# Patient Record
Sex: Female | Born: 1965 | Race: White | Hispanic: No | Marital: Married | Smoking: Never smoker
Health system: Southern US, Community
[De-identification: ages and names within clinical notes are randomized; demographics above are authoritative.]

## PROBLEM LIST (undated history)

## (undated) DIAGNOSIS — F909 Attention-deficit hyperactivity disorder, unspecified type: Secondary | ICD-10-CM

## (undated) DIAGNOSIS — C73 Malignant neoplasm of thyroid gland: Secondary | ICD-10-CM

## (undated) DIAGNOSIS — E785 Hyperlipidemia, unspecified: Secondary | ICD-10-CM

## (undated) DIAGNOSIS — M51369 Other intervertebral disc degeneration, lumbar region without mention of lumbar back pain or lower extremity pain: Secondary | ICD-10-CM

## (undated) DIAGNOSIS — I10 Essential (primary) hypertension: Secondary | ICD-10-CM

## (undated) DIAGNOSIS — C50919 Malignant neoplasm of unspecified site of unspecified female breast: Secondary | ICD-10-CM

## (undated) DIAGNOSIS — K589 Irritable bowel syndrome without diarrhea: Secondary | ICD-10-CM

## (undated) DIAGNOSIS — J302 Other seasonal allergic rhinitis: Secondary | ICD-10-CM

## (undated) DIAGNOSIS — E039 Hypothyroidism, unspecified: Secondary | ICD-10-CM

## (undated) DIAGNOSIS — M5136 Other intervertebral disc degeneration, lumbar region: Secondary | ICD-10-CM

## (undated) DIAGNOSIS — C801 Malignant (primary) neoplasm, unspecified: Secondary | ICD-10-CM

## (undated) DIAGNOSIS — G4733 Obstructive sleep apnea (adult) (pediatric): Secondary | ICD-10-CM

## (undated) DIAGNOSIS — K219 Gastro-esophageal reflux disease without esophagitis: Secondary | ICD-10-CM

## (undated) HISTORY — PX: THYROID SURGERY: SHX805

## (undated) HISTORY — DX: Malignant neoplasm of unspecified site of unspecified female breast: C50.919

## (undated) HISTORY — DX: Gastro-esophageal reflux disease without esophagitis: K21.9

## (undated) HISTORY — DX: Hypothyroidism, unspecified: E03.9

## (undated) HISTORY — DX: Obstructive sleep apnea (adult) (pediatric): G47.33

## (undated) HISTORY — DX: Irritable bowel syndrome, unspecified: K58.9

## (undated) HISTORY — DX: Hyperlipidemia, unspecified: E78.5

## (undated) HISTORY — DX: Other seasonal allergic rhinitis: J30.2

## (undated) HISTORY — DX: Attention-deficit hyperactivity disorder, unspecified type: F90.9

## (undated) HISTORY — DX: Other intervertebral disc degeneration, lumbar region: M51.36

## (undated) HISTORY — DX: Other intervertebral disc degeneration, lumbar region without mention of lumbar back pain or lower extremity pain: M51.369

## (undated) HISTORY — DX: Malignant neoplasm of thyroid gland: C73

---

## 2006-04-01 ENCOUNTER — Ambulatory Visit: Payer: Self-pay | Admitting: Obstetrics and Gynecology

## 2007-06-04 ENCOUNTER — Ambulatory Visit: Payer: Self-pay

## 2009-03-01 ENCOUNTER — Ambulatory Visit: Payer: Self-pay | Admitting: Obstetrics and Gynecology

## 2010-04-30 ENCOUNTER — Inpatient Hospital Stay: Payer: Self-pay

## 2012-04-28 ENCOUNTER — Ambulatory Visit: Payer: Self-pay | Admitting: Internal Medicine

## 2013-06-13 ENCOUNTER — Ambulatory Visit: Payer: Self-pay | Admitting: Physician Assistant

## 2013-07-14 ENCOUNTER — Ambulatory Visit: Payer: Self-pay | Admitting: Internal Medicine

## 2013-09-07 ENCOUNTER — Ambulatory Visit: Payer: Self-pay | Admitting: Surgery

## 2013-09-07 DIAGNOSIS — I10 Essential (primary) hypertension: Secondary | ICD-10-CM

## 2013-09-07 LAB — CBC WITH DIFFERENTIAL/PLATELET
BASOS ABS: 0 10*3/uL (ref 0.0–0.1)
BASOS PCT: 0.3 %
EOS PCT: 1.2 %
Eosinophil #: 0.1 10*3/uL (ref 0.0–0.7)
HCT: 36.3 % (ref 35.0–47.0)
HGB: 12.3 g/dL (ref 12.0–16.0)
LYMPHS PCT: 20.2 %
Lymphocyte #: 1.5 10*3/uL (ref 1.0–3.6)
MCH: 27.6 pg (ref 26.0–34.0)
MCHC: 33.8 g/dL (ref 32.0–36.0)
MCV: 82 fL (ref 80–100)
MONO ABS: 0.3 x10 3/mm (ref 0.2–0.9)
Monocyte %: 3.8 %
NEUTROS PCT: 74.5 %
Neutrophil #: 5.5 10*3/uL (ref 1.4–6.5)
Platelet: 244 10*3/uL (ref 150–440)
RBC: 4.45 10*6/uL (ref 3.80–5.20)
RDW: 15.1 % — ABNORMAL HIGH (ref 11.5–14.5)
WBC: 7.4 10*3/uL (ref 3.6–11.0)

## 2013-09-07 LAB — BASIC METABOLIC PANEL
ANION GAP: 3 — AB (ref 7–16)
BUN: 13 mg/dL (ref 7–18)
CREATININE: 0.65 mg/dL (ref 0.60–1.30)
Calcium, Total: 9.3 mg/dL (ref 8.5–10.1)
Chloride: 103 mmol/L (ref 98–107)
Co2: 32 mmol/L (ref 21–32)
EGFR (African American): >60
EGFR (Non-African Amer.): >60
Glucose: 109 mg/dL — ABNORMAL HIGH (ref 65–99)
Osmolality: 276 (ref 275–301)
POTASSIUM: 3.6 mmol/L (ref 3.5–5.1)
Sodium: 138 mmol/L (ref 136–145)

## 2013-09-07 LAB — HEPATIC FUNCTION PANEL A (ARMC)
ALBUMIN: 4.3 g/dL (ref 3.4–5.0)
ALK PHOS: 106 U/L
ALT: 90 U/L — AB (ref 12–78)
BILIRUBIN TOTAL: 0.6 mg/dL (ref 0.2–1.0)
SGOT(AST): 47 U/L — ABNORMAL HIGH (ref 15–37)
Total Protein: 7.9 g/dL (ref 6.4–8.2)

## 2013-09-16 ENCOUNTER — Ambulatory Visit: Payer: Self-pay | Admitting: Surgery

## 2013-09-16 HISTORY — PX: THYROID SURGERY: SHX805

## 2013-09-17 LAB — CREATININE, SERUM
CREATININE: 0.8 mg/dL (ref 0.60–1.30)
EGFR (Non-African Amer.): >60

## 2013-09-17 LAB — ALBUMIN: Albumin: 3.8 g/dL (ref 3.4–5.0)

## 2013-09-17 LAB — CALCIUM: Calcium, Total: 8.5 mg/dL (ref 8.5–10.1)

## 2013-09-20 LAB — PATHOLOGY REPORT

## 2013-10-25 ENCOUNTER — Ambulatory Visit: Payer: Self-pay

## 2013-11-04 ENCOUNTER — Ambulatory Visit: Payer: Self-pay

## 2014-09-01 HISTORY — PX: OTHER SURGICAL HISTORY: SHX169

## 2014-12-23 NOTE — Op Note (Signed)
PATIENT NAME:  Lori Castro, Lori Castro MR#:  [id number] DATE OF BIRTH:  [birth year]  DATE OF PROCEDURE:  09/16/2013  PREOPERATIVE DIAGNOSIS: Papillary carcinoma of the thyroid.   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   OPERATION PERFORMED: Total thyroidectomy with central lymph node dissection.   SURGEON: Consuela Mimes, M.D.   FIRST ASSISTANT: Marlyce Huge, MD.   ANESTHESIA: General.   PROCEDURE IN DETAIL: The patient was placed supine on the operating room table and prepped and draped in the usual sterile fashion. An incision was made in the neck crease 2 fingerbreadths above the suprasternal notch and this was carried down through the subcutaneous tissue and the platysma with electrocautery and subplatysmal flaps are created superiorly and inferiorly. The intermediate fascia of the neck was opened in the midline between the strap muscles and the right strap muscle was dissected off of the very diminutive and fairly hard and adherent right thyroid lobe. On the right side, the inferior parathyroid gland was identified as was the recurrent laryngeal nerve and these were spared and left unharmed throughout the procedure. On the right, the superior parathyroid gland was also identified and left unharmed and at the conclusion of the dissection on the right side of the neck, both parathyroid glands were entirely viable. The middle thyroid vein was ligated and divided with the Harmonic scalpel and inferior pole vessels were ligated and divided with the Harmonic scalpel and superior pole vessels were ligated and divided with the Harmonic scalpel and the thyroid lobe was rotated medially. It was located very high in the neck with the isthmus essentially at the cricothyroid membrane and this was dissected off of the trachea and dissection along the anterior border of the trachea was performed medial to the right  recurrent laryngeal nerve such that some of the central compartment lymph nodes (hose underneath the recurrent laryngeal nerve and lateral to it, on the right) were not excised. This central compartment lymph node excision was continued over to the left side of the trachea and here it included a portion of the superior left thymus gland. On the left side, the strap muscles were densely adherent to the small very firm tumor, which was located right on the cricothyroid membrane. The only way to safely perform the procedure was to remove a portion of the strap muscles and leave them adherent to the cancerous nodule.   On the left, the superior pole vessels were ligated and divided with the Harmonic scalpel and the superior parathyroid gland was identified and spared. Since three parathyroid glands were viable, I elected to intentionally remove the left inferior parathyroid gland as part of the central lymph node dissection. The lateral border of this dissection was the carotid sheath and working medially the recurrent laryngeal nerve on the left side was identified and spared. It had a very early bifurcation in that it bifurcated at approximately the level of the superior edge of the thymus gland. Both branches of the left recurrent laryngeal nerve ran parallel to one another all the way to the cricothyroid membrane. Dissection here was performed tediously and the central lymph node dissection was split in order to preserve the nerve and ultimately the specimen was left attached by the tumor to both the nerve and the trachea and cricothyroid membrane. I was able to remove the tumor from all of the trachea, but dissection of the tumor off of the recurrent laryngeal nerve was extremely tedious and  performed sharply with a scalpel. I utilized the Harmonic scalpel and bipolar electrocautery when near the nerve as well as placement of 1 or 2 very small hemoclips in order to do my best to preserve its function, but  ultimately as the specimen was removed; there was a portion of cancer left attached to both of the left recurrent laryngeal nerve branches. This tumor measured 2 x 3 x 4 mm and was present approximately 3 mm prior to its piercing the cricothyroid membrane. Hemostasis here was achieved with topical thrombin-soaked Gelfoam, which was removed prior to closure. Otherwise, hemostasis was excellent.   A TLS drain was placed on both sides of the neck and brought out through the suprasternal notch and the intermediate fascia was closed with a running 3-0 Monocryl suture. The platysma was closed with a running 3-0 Monocryl suture and the skin was closed with a running subcuticular 5-0 Monocryl and suture strips. The patient tolerated the procedure well and there were no complications.    ____________________________ Consuela Mimes, MD wfm:aw D: 09/16/2013 10:53:39 ET T: 09/16/2013 11:11:18 ET JOB#: [id number]  cc: Consuela Mimes, MD, <Dictator> A. Lavone Orn, MD Consuela Mimes MD ELECTRONICALLY SIGNED 09/23/2013 23:00

## 2014-12-27 ENCOUNTER — Other Ambulatory Visit: Payer: Self-pay | Admitting: Internal Medicine

## 2014-12-27 DIAGNOSIS — R748 Abnormal levels of other serum enzymes: Secondary | ICD-10-CM

## 2015-01-02 ENCOUNTER — Ambulatory Visit: Payer: BC Managed Care – PPO

## 2015-01-05 ENCOUNTER — Ambulatory Visit
Admission: RE | Admit: 2015-01-05 | Discharge: 2015-01-05 | Disposition: A | Discharge status: Home / Self Care | Payer: BC Managed Care – PPO | Source: Ambulatory Visit | Attending: Internal Medicine | Admitting: Internal Medicine

## 2015-01-05 DIAGNOSIS — K829 Disease of gallbladder, unspecified: Secondary | ICD-10-CM | POA: Diagnosis not present

## 2015-01-05 DIAGNOSIS — R945 Abnormal results of liver function studies: Secondary | ICD-10-CM | POA: Diagnosis present

## 2015-01-05 DIAGNOSIS — R748 Abnormal levels of other serum enzymes: Secondary | ICD-10-CM

## 2015-07-04 ENCOUNTER — Other Ambulatory Visit: Payer: Self-pay | Admitting: Internal Medicine

## 2015-07-04 DIAGNOSIS — Z1231 Encounter for screening mammogram for malignant neoplasm of breast: Secondary | ICD-10-CM

## 2015-07-18 ENCOUNTER — Ambulatory Visit: Payer: BC Managed Care – PPO | Attending: Internal Medicine

## 2016-03-03 ENCOUNTER — Other Ambulatory Visit: Payer: Self-pay | Admitting: Internal Medicine

## 2016-03-03 DIAGNOSIS — N63 Unspecified lump in unspecified breast: Secondary | ICD-10-CM

## 2016-03-13 ENCOUNTER — Ambulatory Visit
Admission: RE | Admit: 2016-03-13 | Discharge: 2016-03-13 | Disposition: A | Discharge status: Home / Self Care | Payer: BC Managed Care – PPO | Source: Ambulatory Visit | Attending: Internal Medicine | Admitting: Internal Medicine

## 2016-03-13 DIAGNOSIS — N63 Unspecified lump in unspecified breast: Secondary | ICD-10-CM

## 2016-03-13 DIAGNOSIS — N644 Mastodynia: Secondary | ICD-10-CM | POA: Diagnosis not present

## 2016-03-13 DIAGNOSIS — N6489 Other specified disorders of breast: Secondary | ICD-10-CM | POA: Insufficient documentation

## 2016-12-09 ENCOUNTER — Ambulatory Visit: Payer: BC Managed Care – PPO | Attending: Internal Medicine

## 2016-12-09 DIAGNOSIS — R413 Other amnesia: Secondary | ICD-10-CM | POA: Insufficient documentation

## 2016-12-09 DIAGNOSIS — I10 Essential (primary) hypertension: Secondary | ICD-10-CM | POA: Insufficient documentation

## 2016-12-09 DIAGNOSIS — R51 Headache: Secondary | ICD-10-CM | POA: Diagnosis not present

## 2016-12-09 DIAGNOSIS — G4730 Sleep apnea, unspecified: Secondary | ICD-10-CM | POA: Diagnosis present

## 2016-12-09 DIAGNOSIS — G4733 Obstructive sleep apnea (adult) (pediatric): Secondary | ICD-10-CM | POA: Insufficient documentation

## 2016-12-09 DIAGNOSIS — R0683 Snoring: Secondary | ICD-10-CM | POA: Insufficient documentation

## 2016-12-09 DIAGNOSIS — G4710 Hypersomnia, unspecified: Secondary | ICD-10-CM | POA: Diagnosis present

## 2017-04-15 ENCOUNTER — Ambulatory Visit
Admission: RE | Admit: 2017-04-15 | Discharge: 2017-04-15 | Disposition: A | Discharge status: Home / Self Care | Payer: BC Managed Care – PPO | Source: Ambulatory Visit | Attending: Internal Medicine | Admitting: Internal Medicine

## 2017-04-15 ENCOUNTER — Other Ambulatory Visit: Payer: Self-pay | Admitting: Internal Medicine

## 2017-04-15 DIAGNOSIS — J351 Hypertrophy of tonsils: Secondary | ICD-10-CM | POA: Diagnosis not present

## 2017-04-15 DIAGNOSIS — M47812 Spondylosis without myelopathy or radiculopathy, cervical region: Secondary | ICD-10-CM | POA: Diagnosis not present

## 2017-04-15 DIAGNOSIS — I6523 Occlusion and stenosis of bilateral carotid arteries: Secondary | ICD-10-CM | POA: Insufficient documentation

## 2017-04-15 DIAGNOSIS — Z85850 Personal history of malignant neoplasm of thyroid: Secondary | ICD-10-CM | POA: Diagnosis not present

## 2017-04-15 DIAGNOSIS — R221 Localized swelling, mass and lump, neck: Secondary | ICD-10-CM | POA: Diagnosis present

## 2017-04-15 DIAGNOSIS — R590 Localized enlarged lymph nodes: Secondary | ICD-10-CM | POA: Insufficient documentation

## 2017-04-15 HISTORY — DX: Malignant (primary) neoplasm, unspecified: C80.1

## 2017-04-15 HISTORY — DX: Essential (primary) hypertension: I10

## 2017-04-15 MED ORDER — IOPAMIDOL (ISOVUE-300) INJECTION 61%
75.0000 mL | Freq: Once | INTRAVENOUS | Status: AC | PRN
  Administered 2017-04-15: 75 mL via INTRAVENOUS

## 2017-08-04 ENCOUNTER — Other Ambulatory Visit: Payer: Self-pay | Admitting: Internal Medicine

## 2017-08-05 ENCOUNTER — Other Ambulatory Visit: Payer: Self-pay | Admitting: Internal Medicine

## 2017-08-05 DIAGNOSIS — N649 Disorder of breast, unspecified: Secondary | ICD-10-CM

## 2017-09-14 ENCOUNTER — Other Ambulatory Visit: Payer: BC Managed Care – PPO

## 2017-11-03 ENCOUNTER — Encounter: Payer: Self-pay | Admitting: Emergency Medicine

## 2017-11-03 ENCOUNTER — Emergency Department
Admission: EM | Admit: 2017-11-03 | Discharge: 2017-11-03 | Disposition: A | Discharge status: Home / Self Care | Payer: BC Managed Care – PPO | Attending: Emergency Medicine | Admitting: Emergency Medicine

## 2017-11-03 ENCOUNTER — Emergency Department: Payer: BC Managed Care – PPO

## 2017-11-03 ENCOUNTER — Other Ambulatory Visit: Payer: Self-pay

## 2017-11-03 DIAGNOSIS — Z85850 Personal history of malignant neoplasm of thyroid: Secondary | ICD-10-CM | POA: Insufficient documentation

## 2017-11-03 DIAGNOSIS — Y998 Other external cause status: Secondary | ICD-10-CM | POA: Insufficient documentation

## 2017-11-03 DIAGNOSIS — M542 Cervicalgia: Secondary | ICD-10-CM | POA: Insufficient documentation

## 2017-11-03 DIAGNOSIS — Y939 Activity, unspecified: Secondary | ICD-10-CM | POA: Diagnosis not present

## 2017-11-03 DIAGNOSIS — I10 Essential (primary) hypertension: Secondary | ICD-10-CM | POA: Insufficient documentation

## 2017-11-03 DIAGNOSIS — Y92410 Unspecified street and highway as the place of occurrence of the external cause: Secondary | ICD-10-CM | POA: Insufficient documentation

## 2017-11-03 MED ORDER — MELOXICAM 7.5 MG PO TABS
15.0000 mg | ORAL_TABLET | Freq: Every day | ORAL | Status: DC
  Administered 2017-11-03: 15 mg via ORAL
  Filled 2017-11-03: qty 2

## 2017-11-03 MED ORDER — MELOXICAM 15 MG PO TABS: 15.0000 mg | ORAL_TABLET | Freq: Every day | ORAL | 1 refills | Status: AC

## 2017-11-03 MED ORDER — CYCLOBENZAPRINE HCL 10 MG PO TABS: 10.0000 mg | ORAL_TABLET | Freq: Three times a day (TID) | ORAL | 0 refills | Status: AC | PRN
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
## 2018-03-24 HISTORY — PX: OTHER SURGICAL HISTORY: SHX169

## 2020-04-13 HISTORY — PX: COLONOSCOPY: SHX174

## 2020-09-06 ENCOUNTER — Other Ambulatory Visit: Payer: Self-pay

## 2020-09-06 ENCOUNTER — Ambulatory Visit: Payer: BC Managed Care – PPO | Admitting: Dermatology

## 2020-09-06 DIAGNOSIS — L821 Other seborrheic keratosis: Secondary | ICD-10-CM | POA: Diagnosis not present

## 2020-09-06 DIAGNOSIS — L578 Other skin changes due to chronic exposure to nonionizing radiation: Secondary | ICD-10-CM | POA: Diagnosis not present

## 2020-09-06 DIAGNOSIS — L820 Inflamed seborrheic keratosis: Secondary | ICD-10-CM

## 2020-09-06 DIAGNOSIS — L308 Other specified dermatitis: Secondary | ICD-10-CM

## 2020-09-06 MED ORDER — TRIAMCINOLONE ACETONIDE 0.1 % EX OINT: 1.0000 "application " | TOPICAL_OINTMENT | Freq: Every evening | CUTANEOUS | 0 refills | Status: DC | PRN

## 2020-09-06 MED ORDER — TRIAMCINOLONE ACETONIDE 0.1 % EX CREA: 1.0000 "application " | TOPICAL_CREAM | Freq: Every day | CUTANEOUS | 0 refills | Status: DC | PRN

## 2020-09-06 NOTE — Patient Instructions (Signed)

## 2020-09-06 NOTE — Progress Notes (Unsigned)
   New Patient Visit  Subjective  Lori Castro is a 55 y.o. female who presents for the following: Rash (Fingers - get dry and crack) and Other (Spot on scalp that seems bigger and a spot on her back).  The following portions of the chart were reviewed this encounter and updated as appropriate:   Tobacco  Allergies  Meds  Problems  Med Hx  Surg Hx  Fam Hx     Review of Systems:  No other skin or systemic complaints except as noted in HPI or Assessment and Plan.  Objective  Well appearing patient in no apparent distress; mood and affect are within normal limits.  A focused examination was performed including scalp, face, hands, back. Relevant physical exam findings are noted in the Assessment and Plan.  Objective  Right upper back paraspinal x 1, vertex scalp x 1, right sup medial scapula x 1 (3): 1.0 cm pink verrucous papule of vertex scalp. 1.2 cm waxy brown papule of right upper back paraspinal. 0.5 cm waxy brown papule of right sup medi scapula  Objective  Hands: Pink scaly patches    Assessment & Plan    Actinic Damage - chronic, secondary to cumulative UV radiation exposure/sun exposure over time - diffuse scaly erythematous macules with underlying dyspigmentation - Recommend daily broad spectrum sunscreen SPF 30+ to sun-exposed areas, reapply every 2 hours as needed.  - Call for new or changing lesions.  Seborrheic Keratoses - Stuck-on, waxy, tan-brown papules and plaques  - Discussed benign etiology and prognosis. - Observe - Call for any changes  Inflamed seborrheic keratosis (3) Right upper back paraspinal x 1, vertex scalp x 1, right sup medial scapula x 1  May consider biopsy if not resolved  Destruction of lesion - Right upper back paraspinal x 1, vertex scalp x 1, right sup medial scapula x 1 Complexity: simple   Destruction method: cryotherapy   Informed consent: discussed and consent obtained   Timeout:  patient name, date of birth, surgical  site, and procedure verified Lesion destroyed using liquid nitrogen: Yes   Region frozen until ice ball extended beyond lesion: Yes   Outcome: patient tolerated procedure well with no complications   Post-procedure details: wound care instructions given    Eczema; Atopic Dermatitis; Hand Dermatitis Hands Atopic dermatitis (eczema) is a chronic, relapsing, pruritic condition that can significantly affect quality of life. It is often associated with allergic rhinitis and/or asthma and can require treatment with topical medications, phototherapy, or in severe cases a biologic medication called Dupixent in older children and adults.    Start TMC 0.1% oint qhs prn and TMC 0.1% cream qd prn - Avoid face, groin, underarms.  May consider Eucrisa in the future.  triamcinolone (KENALOG) 0.1 % - Hands  triamcinolone ointment (KENALOG) 0.1 % - Hands  Return in about 2 months (around 11/04/2020).  I, Joanie Coddington, CMA, am acting as scribe for Armida Sans, MD .  Documentation: I have reviewed the above documentation for accuracy and completeness, and I agree with the above.  Armida Sans, MD

## 2020-09-07 ENCOUNTER — Encounter: Payer: Self-pay | Admitting: Dermatology

## 2020-11-08 ENCOUNTER — Ambulatory Visit: Payer: BC Managed Care – PPO | Admitting: Dermatology

## 2021-08-21 ENCOUNTER — Other Ambulatory Visit: Payer: Self-pay | Admitting: Internal Medicine

## 2021-08-21 DIAGNOSIS — Z1231 Encounter for screening mammogram for malignant neoplasm of breast: Secondary | ICD-10-CM

## 2021-10-08 ENCOUNTER — Ambulatory Visit
Admission: RE | Admit: 2021-10-08 | Discharge: 2021-10-08 | Disposition: A | Discharge status: Home / Self Care | Payer: BC Managed Care – PPO | Source: Ambulatory Visit | Attending: Internal Medicine | Admitting: Internal Medicine

## 2021-10-08 ENCOUNTER — Other Ambulatory Visit: Payer: Self-pay

## 2021-10-08 DIAGNOSIS — Z1231 Encounter for screening mammogram for malignant neoplasm of breast: Secondary | ICD-10-CM | POA: Insufficient documentation

## 2021-10-11 ENCOUNTER — Other Ambulatory Visit: Payer: Self-pay | Admitting: Internal Medicine

## 2021-10-11 DIAGNOSIS — R928 Other abnormal and inconclusive findings on diagnostic imaging of breast: Secondary | ICD-10-CM

## 2021-10-11 DIAGNOSIS — N6310 Unspecified lump in the right breast, unspecified quadrant: Secondary | ICD-10-CM

## 2021-10-29 ENCOUNTER — Ambulatory Visit
Admission: RE | Admit: 2021-10-29 | Discharge: 2021-10-29 | Disposition: A | Discharge status: Home / Self Care | Payer: BC Managed Care – PPO | Source: Ambulatory Visit | Attending: Internal Medicine | Admitting: Internal Medicine

## 2021-10-29 ENCOUNTER — Other Ambulatory Visit: Payer: Self-pay

## 2021-10-29 DIAGNOSIS — N6310 Unspecified lump in the right breast, unspecified quadrant: Secondary | ICD-10-CM

## 2021-10-29 DIAGNOSIS — R928 Other abnormal and inconclusive findings on diagnostic imaging of breast: Secondary | ICD-10-CM | POA: Diagnosis not present

## 2021-10-31 ENCOUNTER — Other Ambulatory Visit: Payer: Self-pay | Admitting: Internal Medicine

## 2021-10-31 DIAGNOSIS — N63 Unspecified lump in unspecified breast: Secondary | ICD-10-CM

## 2021-10-31 DIAGNOSIS — R928 Other abnormal and inconclusive findings on diagnostic imaging of breast: Secondary | ICD-10-CM

## 2021-11-12 ENCOUNTER — Ambulatory Visit
Admission: RE | Admit: 2021-11-12 | Discharge: 2021-11-12 | Disposition: A | Discharge status: Home / Self Care | Payer: BC Managed Care – PPO | Source: Ambulatory Visit | Attending: Internal Medicine | Admitting: Internal Medicine

## 2021-11-12 ENCOUNTER — Other Ambulatory Visit: Payer: Self-pay

## 2021-11-12 DIAGNOSIS — R928 Other abnormal and inconclusive findings on diagnostic imaging of breast: Secondary | ICD-10-CM

## 2021-11-12 DIAGNOSIS — N63 Unspecified lump in unspecified breast: Secondary | ICD-10-CM

## 2021-11-12 DIAGNOSIS — N6311 Unspecified lump in the right breast, upper outer quadrant: Secondary | ICD-10-CM | POA: Diagnosis present

## 2021-11-13 DIAGNOSIS — C50919 Malignant neoplasm of unspecified site of unspecified female breast: Secondary | ICD-10-CM

## 2021-11-14 LAB — SURGICAL PATHOLOGY

## 2021-11-15 ENCOUNTER — Encounter: Payer: Self-pay | Admitting: *Deleted

## 2021-11-15 HISTORY — PX: OTHER SURGICAL HISTORY: SHX169

## 2021-11-17 DIAGNOSIS — C50411 Malignant neoplasm of upper-outer quadrant of right female breast: Secondary | ICD-10-CM | POA: Insufficient documentation

## 2021-11-17 NOTE — Progress Notes (Signed)
?[facility]  ?[phone] [phone] ? ?ID: Lori Castro OB: November 19, 1965  MR#: [id number]  JYN#:[id number] ? ?Patient Care Team: ?Adin Hector, MD as PCP - General (Internal Medicine) ?Theodore Demark, RN as Oncology Nurse Navigator ?Lloyd Huger, MD as Consulting Physician (Oncology) ?Adin Hector, MD as Referring Physician (Internal Medicine) ?Noreene Filbert, MD as Consulting Physician (Radiation Oncology) ? 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 ? ?INTERVAL HISTORY: Patient is a 56 year old female who was noted to have an abnormality on routine screening mammogram.  Subsequent ultrasound biopsy revealed the above-stated malignancy.  She currently feels well and is asymptomatic.  She has no neurologic complaints.  She denies any recent fevers or illnesses.  She has a good appetite and denies weight loss.  She has no chest pain, shortness of breath, cough, or hemoptysis.  She denies any nausea, vomiting, constipation, or diarrhea.  She has no urinary complaints.  Patient otherwise feels well and offers no further specific complaints today. ? ?REVIEW OF SYSTEMS:   ?Review of Systems  ?Constitutional: Negative.  Negative for fever, malaise/fatigue and weight loss.  ?Respiratory: Negative.  Negative for cough, hemoptysis and shortness of breath.   ?Cardiovascular: Negative.  Negative for chest pain and leg swelling.  ?Gastrointestinal: Negative.  Negative for abdominal pain.  ?Genitourinary: Negative.  Negative for dysuria.  ?Musculoskeletal: Negative.  Negative for back pain.  ?Skin: Negative.  Negative for rash.  ?Neurological: Negative.  Negative for dizziness, focal weakness, weakness and headaches.  ?Psychiatric/Behavioral: Negative.  The patient is not nervous/anxious.   ? ?As per HPI. Otherwise, a complete review of systems is negative. ? ?PAST MEDICAL HISTORY: ?Past  Medical History:  ?Diagnosis Date  ? ADHD   ? Breast cancer ([place])   ? Cancer [facility])   ? Thyroid  ? DDD (degenerative disc disease), lumbar   ? GERD (gastroesophageal reflux disease)   ? Hyperlipemia   ? Hypertension   ? Hypothyroidism   ? IBS (irritable bowel syndrome)   ? OSA (obstructive sleep apnea)   ? Papillary thyroid carcinoma ([place])   ? Seasonal allergies   ? ? ?PAST SURGICAL HISTORY: ?Past Surgical History:  ?Procedure Laterality Date  ? CESAREAN SECTION    ? COLONOSCOPY  04/13/2020  ? NEUROPLASTY VOCAL CORD  2016  ? R Long Trigger Finger Release  03/24/2018  ? THYROID SURGERY  09/16/2013  ? THYROIDECTOMY TOTAL  ? ultrasound guided core breast biopsy  11/15/2021  ? ? ?FAMILY HISTORY: ?Family History  ?Problem Relation Age of Onset  ? Hypertension Mother   ? Arthritis Mother   ? Hyperlipidemia Mother   ? Hypertension Father   ? Hyperlipidemia Father   ? Diabetes Father   ? Hyperlipidemia Brother   ? Hypertension Brother   ? Thyroid cancer Paternal Grandmother   ? Hypothyroidism Paternal Grandmother   ? Breast cancer Neg Hx   ? ? ?ADVANCED DIRECTIVES (Y/N):  N ? ?HEALTH MAINTENANCE: ?Social History  ? ?Tobacco Use  ? Smoking status: Never  ? Smokeless tobacco: Never  ?Substance Use Topics  ? Alcohol use: Yes  ?  Comment: occasional on the weekend  ? Drug use: No  ? ? ? Colonoscopy: ? PAP: ? Bone density: ? Lipid panel: ? ?Allergies  ?Allergen Reactions  ? Amlodipine Other (See Comments)  ?  Sedation   ? ? ?Current Outpatient Medications  ?Medication Sig Dispense Refill  ?  atorvastatin (LIPITOR) 80 MG tablet Take 80 mg by mouth daily.    ? cyclobenzaprine (FLEXERIL) 10 MG tablet Take 10 mg by mouth 3 (three) times daily as needed for muscle spasms.    ? escitalopram (LEXAPRO) 20 MG tablet Take 20 mg by mouth daily.    ? levothyroxine (SYNTHROID) 150 MCG tablet Take 150 mcg by mouth daily before breakfast.    ? losartan (COZAAR) 100 MG tablet Take 100 mg by mouth daily.    ? metoprolol succinate (TOPROL-XL) 25 MG  24 hr tablet Take 25 mg by mouth daily.    ? ?No current facility-administered medications for this visit.  ? ? ?OBJECTIVE: ?Vitals:  ? 11/19/21 1002  ?BP: 134/69  ?Pulse: 88  ?Resp: 16  ?Temp: 97.8 ?F (36.6 ?C)  ?SpO2: 99%  ?   Body mass index is 29.62 kg/m?[place] Kitchen    ECOG FS:0 - Asymptomatic ? ?General: Well-developed, well-nourished, no acute distress. ?Eyes: Pink conjunctiva, anicteric sclera. ?HEENT: Normocephalic, moist mucous membranes. ?Breast: Exam deferred today. ?Lungs: No audible wheezing or coughing. ?Heart: Regular rate and rhythm. ?Abdomen: Soft, nontender, no obvious distention. ?Musculoskeletal: No edema, cyanosis, or clubbing. ?Neuro: Alert, answering all questions appropriately. Cranial nerves grossly intact. ?Skin: No rashes or petechiae noted. ?Psych: Normal affect. ?Lymphatics: No cervical, calvicular, axillary or inguinal LAD. ? ? ?LAB RESULTS: ? ?Lab Results  ?Component Value Date  ? Castro 138 09/07/2013  ? K 3.6 09/07/2013  ? CL 103 09/07/2013  ? CO2 32 09/07/2013  ? GLUCOSE 109 (H) 09/07/2013  ? BUN 13 09/07/2013  ? CREATININE 0.80 09/17/2013  ? CALCIUM 8.5 09/17/2013  ? PROT 7.9 09/07/2013  ? ALBUMIN 3.8 09/17/2013  ? AST 47 (H) 09/07/2013  ? ALT 90 (H) 09/07/2013  ? ALKPHOS 106 09/07/2013  ? BILITOT 0.6 09/07/2013  ? GFRNONAA >60 09/17/2013  ? GFRAA >60 09/17/2013  ? ? ?Lab Results  ?Component Value Date  ? WBC 7.4 09/07/2013  ? NEUTROABS 5.5 09/07/2013  ? HGB 12.3 09/07/2013  ? HCT 36.3 09/07/2013  ? MCV 82 09/07/2013  ? PLT 244 09/07/2013  ? ? ? ?STUDIES: ?US BREAST LTD UNI RIGHT INC AXILLA ? ?Result Date: 10/29/2021 ?CLINICAL DATA:  Patient recalled from screening for right breast mass. EXAM: DIGITAL DIAGNOSTIC UNILATERAL RIGHT MAMMOGRAM WITH TOMOSYNTHESIS AND CAD; ULTRASOUND RIGHT BREAST LIMITED TECHNIQUE: Right digital diagnostic mammography and breast tomosynthesis was performed. The images were evaluated with computer-aided detection.; Targeted ultrasound examination of the right breast was  performed COMPARISON:  Previous exam(s). ACR Breast Density Category c: The breast tissue is heterogeneously dense, which may obscure small masses. FINDINGS: Within the upper-outer right breast middle to posterior depth there is a persistent small spiculated mass further evaluated with additional imaging. Additional left MLO view was obtained. No suspicious abnormality within the left breast. Targeted ultrasound is performed, showing a 9 x 8 x 8 mm irregular hypoechoic mass right breast 9:30 o'clock 4 cm from the nipple. No right axillary adenopathy. IMPRESSION: Suspicious right breast mass 9:30 o'clock. RECOMMENDATION: Ultrasound-guided core needle biopsy right breast mass 9:30 o'clock. I have discussed the findings and recommendations with the patient. If applicable, a reminder letter will be sent to the patient regarding the next appointment. BI-RADS CATEGORY  5: Highly suggestive of malignancy. Electronically Signed   By: Lovey Newcomer M.D.   On: 10/29/2021 11:47 ? ?MM DIAG BREAST TOMO UNI RIGHT ? ?Result Date: 10/29/2021 ?CLINICAL DATA:  Patient recalled from screening for right breast mass. EXAM: DIGITAL DIAGNOSTIC UNILATERAL  RIGHT MAMMOGRAM WITH TOMOSYNTHESIS AND CAD; ULTRASOUND RIGHT BREAST LIMITED TECHNIQUE: Right digital diagnostic mammography and breast tomosynthesis was performed. The images were evaluated with computer-aided detection.; Targeted ultrasound examination of the right breast was performed COMPARISON:  Previous exam(s). ACR Breast Density Category c: The breast tissue is heterogeneously dense, which may obscure small masses. FINDINGS: Within the upper-outer right breast middle to posterior depth there is a persistent small spiculated mass further evaluated with additional imaging. Additional left MLO view was obtained. No suspicious abnormality within the left breast. Targeted ultrasound is performed, showing a 9 x 8 x 8 mm irregular hypoechoic mass right breast 9:30 o'clock 4 cm from the  nipple. No right axillary adenopathy. IMPRESSION: Suspicious right breast mass 9:30 o'clock. RECOMMENDATION: Ultrasound-guided core needle biopsy right breast mass 9:30 o'clock. I have discussed the findings

## 2021-11-18 ENCOUNTER — Other Ambulatory Visit: Payer: Self-pay | Admitting: General Surgery

## 2021-11-18 DIAGNOSIS — C50411 Malignant neoplasm of upper-outer quadrant of right female breast: Secondary | ICD-10-CM

## 2021-11-18 NOTE — Progress Notes (Signed)
Subjective:  ?  ? Patient ID: Lori Castro is a 56 y.o. female. ?  ?HPI ?  ?The following portions of the patient's history were reviewed and updated as appropriate. ?  ?This a new patient is here today for: office visit. The patient has been referred by Al Pimple, RN for evaluation of a newly diagnosed right breast cancer. Patient had a right breast ultrasound guided biopsy on 11-14-21 at [facility]. The patient reports she had been getting yearly mammograms and had not noticed any breast changes prior to her recent mammogram. She denies any breast trauma or nipple discharge.  ?  ?Since being informed of her diagnosis late last week she has been on the Internet and was very concerned that she might have triple negative breast cancer.  She reports a friend recently succumbed to this disease. ?  ?The patient had her first mammogram in several years last month, was called back for additional views and then 5 weeks later had a biopsy.  This showed invasive mammary carcinoma. ?  ?The patient reports that she had been unaware to been since July 2017's since she had her mammograms, but reported at least one was canceled during the [place] pandemic. ?  ?She herself is not aware of any problems. ?  ?She is the mother of 4 and recently remarried gentleman who she had dated in college.  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  Her children are 1, 24, 21 and 11.  A boy followed by 3 girls. ?  ?No prior breast problems.  Her new husband is a retired Tourist information centre manager from the [place]. ?  ?Patient reports her bra size is a 36 C.  ?  ?The patient is accompanied by her husband, Lori Castro.  ?  ?  ?   ?Chief Complaint  ?Patient presents with  ? Treatment Plan Discussion  ?  ?  ?BP (!) 146/88   Pulse 94   Temp 36.6 ?C (97.9 ?F)   Ht 165.1 cm ('5\' 5"' )   Wt 80.3 kg (177 lb)   LMP 05/10/2013 (Approximate)   SpO2 97%   BMI 29.45 kg/m?  ?  ?    ?Past Medical History:  ?Diagnosis Date  ? ADHD (attention deficit  hyperactivity disorder) 11/17/2014  ? Anemia during pregnancy    ? Breast cancer (CMS-HCC) 11/12/2021  ? Depression with anxiety    ?  followed by Dr Owens Shark  ? GERD (gastroesophageal reflux disease)    ? History of hypertension    ? 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 Hyperlipemia    ?  with elevated HDL  ? Hypothyroidism 2007  ? IBS (irritable bowel syndrome)    ? Other and unspecified ovarian cyst    ? Papillary thyroid carcinoma (CMS-HCC) 07/2013  ?  left side 1.5cm with invasion   ? Post-operative hypothyroidism    ? Seasonal allergies    ?  followed by Dr. Donneta Romberg with [facility]  ?  ?  ?     ?Past Surgical History:  ?Procedure Laterality Date  ? CESAREAN SECTION   05/01/2010  ? THYROIDECTOMY TOTAL   09/16/2013  ?  with central node disection, left inferior parathyroid gland resection and resection of portion of the thymus and attached strap muscle fibers by Dr. Leanora Cover   ? NEUROPLASTY VOCAL CORD   10/2014  ?  Dr Joya Gaskins at [facility] 03/24/18  03/24/2018  ?  Hessie Knows, MD  ? COLONOSCOPY   04/13/2020  ?  Normal colon/Repeat 64yr/TKT  ? ultrasound guided core breast biopsy Right 11/12/2021  ?  ?  ?  ?        ?OB History   ?  Gravida  ?4  ? Para  ?4  ? Term  ?   ? Preterm  ?   ? AB  ?   ? Living  ?   ?  ?  SAB  ?   ? IAB  ?   ? Ectopic  ?   ? Molar  ?   ? Multiple  ?   ? Live Births  ?   ?  ?  ?  Obstetric Comments  ?Age at first period 89?Age of first pregnancy 89?   ?  ?   ?  ?  ?Social History  ?  ?  ?     ?Socioeconomic History  ? Marital status: Married  ? Number of children: 4  ?Occupational History  ? Occupation: tPharmacist, hospital ?Tobacco Use  ? Smoking status: Never  ?    Passive exposure: Never  ? Smokeless tobacco: Never  ?Vaping Use  ? Vaping Use: Never used  ?Substance and Sexual Activity  ? Alcohol use: Yes  ?    Comment: on weekends  ? Drug use: No  ? Sexual activity: Defer  ?  ?  ?  ?     ?Allergies  ?Allergen Reactions  ? Amlodipine Other (See  Comments)  ?    sedation  ?  ?  ?Current Medications  ?      ?Current Outpatient Medications  ?Medication Sig Dispense Refill  ? acetaminophen (TYLENOL ORAL) Take by mouth as needed      ? albuterol 90 mcg/actuation inhaler Inhale 2 inhalations into the lungs every 6 (six) hours as needed for Wheezing 18 g 2  ? atorvastatin (LIPITOR) 80 MG tablet TAKE 1 TABLET BY MOUTH EVERY DAY 90 tablet 1  ? chlorthalidone 25 MG tablet TAKE 1 TABLET BY MOUTH EVERY DAY 90 tablet 1  ? escitalopram oxalate (LEXAPRO) 20 MG tablet TAKE 1 TABLET BY MOUTH EVERY DAY 90 tablet 1  ? levothyroxine (SYNTHROID) 150 MCG tablet Take 1 tablet (150 mcg total) by mouth once daily Take on an empty stomach with a glass of water at least 30-60 minutes before breakfast. 30 tablet 11  ? losartan (COZAAR) 100 MG tablet TAKE 1 TABLET BY MOUTH EVERY DAY 60 tablet 2  ? metoprolol succinate (TOPROL-XL) 25 MG XL tablet TAKE 1 TABLET BY MOUTH EVERY DAY 90 tablet 3  ? predniSONE (DELTASONE) 10 MG tablet Take 4 tabs daily for 2 days, then 3 tabs daily for 2 days, then 2 tabs daily for 2 days, then 1 tab daily for 2 days (Patient not taking: Reported on 11/18/2021) 20 tablet 0  ?  ?No current facility-administered medications for this visit.  ?  ?  ?  ?     ?Family History  ?Problem Relation Age of Onset  ? Arthritis Mother    ?      starting in her 334s ? Diabetes Father    ?      adult onset  ? Prostate cancer Paternal Grandfather    ? Thyroid cancer Paternal Grandfather    ? Hypothyroidism Paternal Grandfather    ? Breast cancer Neg Hx    ?  ?  ?  ?Labs and  Radiology:  ?  ?Right breast ultrasound: ?  ?Ultrasound examination of the right breast was undertaken to determine if preoperative localization would be required.  In the 930 o'clock position 4 cm from the nipple a well-defined hypoechoic nodule with a centrally placed clip measuring 0.5 x 0.7 x 0.83 cm is noted.  Slight hyperemia around the tumor site.  This is about 1/2 cm below the skin. ?  ?In the  axilla there are either 2 side-by-side nodes or 1 enlarged node measuring maximum dimension 2.64 cm with a cortex of 0.23 cm.  Prebiopsy assessment by the radiology service showed no right axillary adenopathy, and I suspect that this is postbiopsy change.  BI-RADS-6. ?  ?November 12, 2021 pathology: ?  ?A. BREAST, RIGHT AT 930 O'CLOCK; ULTRASOUND-GUIDED CORE NEEDLE BIOPSY:  ?- INVASIVE MAMMARY CARCINOMA, NO SPECIAL TYPE.  ?- CALCIFICATIONS ASSOCIATED WITH NEOPLASTIC MAMMARY ELEMENTS.  ? ?Size of invasive carcinoma: 9 mm in this sample  ?Histologic grade of invasive carcinoma: Grade 1  ?                     Glandular/tubular differentiation score: 2  ?                     Nuclear pleomorphism score: 2  ?                     Mitotic rate score: 1  ?                     Total score: 5  ?Ductal carcinoma in situ: Present, low-grade  ?Lymphovascular invasion: Not identified  ?  ?CASE SUMMARY: BREAST BIOMARKER TESTS  ?Estrogen Receptor (ER) Status: POSITIVE  ?        Percentage of cells with nuclear positivity: >90%  ?        Average intensity of staining: Strong  ? ?Progesterone Receptor (PgR) Status: POSITIVE  ?        Percentage of cells with nuclear positivity: 11-50%  ?        Average intensity of staining: Moderate  ? ?HER2 (by immunohistochemistry): NEGATIVE (Score 1+) ?  ?Imaging studies from July 2017 through spring 2023 were independently reviewed. ?  ?Radiologist recommendations regarding MRI noted. ?  ?Laboratory review September 26, 2021: ?  ?WBC (White Blood Cell Count) 4.1 - 10.2 10?3/uL 5.5   ?RBC (Red Blood Cell Count) 4.04 - 5.48 10?6/uL 4.06   ?Hemoglobin 12.0 - 15.0 gm/dL 12.2   ?Hematocrit 35.0 - 47.0 % 37.4   ?MCV (Mean Corpuscular Volume) 80.0 - 100.0 fl 92.1   ?MCH (Mean Corpuscular Hemoglobin) 27.0 - 31.2 pg 30.0   ?MCHC (Mean Corpuscular Hemoglobin Concentration) 32.0 - 36.0 gm/dL 32.6   ?Platelet Count 150 - 450 10?3/uL 215   ?RDW-CV (Red Cell Distribution Width) 11.6 - 14.8 % 13.4   ?MPV (Mean  Platelet Volume) 9.4 - 12.4 fl 10.3   ?Neutrophils 1.50 - 7.80 10?3/uL 3.04   ?Lymphocytes 1.00 - 3.60 10?3/uL 1.94   ?Monocytes 0.00 - 1.50 10?3/uL 0.34   ?Eosinophils 0.00 - 0.55 10?3/uL 0.10   ?Basophils 0.00 - 0.09 10?3

## 2021-11-19 ENCOUNTER — Other Ambulatory Visit: Payer: Self-pay

## 2021-11-19 ENCOUNTER — Inpatient Hospital Stay: Payer: BC Managed Care – PPO

## 2021-11-19 ENCOUNTER — Inpatient Hospital Stay: Payer: BC Managed Care – PPO | Attending: Oncology | Admitting: Oncology

## 2021-11-19 ENCOUNTER — Encounter: Payer: Self-pay | Admitting: Oncology

## 2021-11-19 DIAGNOSIS — Z85850 Personal history of malignant neoplasm of thyroid: Secondary | ICD-10-CM | POA: Insufficient documentation

## 2021-11-19 DIAGNOSIS — C50411 Malignant neoplasm of upper-outer quadrant of right female breast: Secondary | ICD-10-CM | POA: Insufficient documentation

## 2021-11-19 DIAGNOSIS — Z170 Estrogen receptor positive status [ER+]: Secondary | ICD-10-CM | POA: Diagnosis not present

## 2021-11-19 NOTE — Progress Notes (Signed)
Received Biopsy results and patient notification from Electa Sniff at [facility]. Navigation initiated.  Scheduled Surgical and Med/Onc consults.  ?

## 2021-11-19 NOTE — Progress Notes (Signed)
Supported patient , and her husband at initial Med/Onc  visit with Dr. Grayland Ormond.  States she is so much less worried after meeting physicians, and reviewing treatment plan.  Given Breast Cancer Treatment Handbook, and packet.  Surgery scheduled for 11/22/21. Follow-up 4/5 with Dr. Grayland Ormond and consult Dr. Baruch Gouty. ?

## 2021-11-20 ENCOUNTER — Other Ambulatory Visit: Payer: Self-pay | Admitting: General Surgery

## 2021-11-20 DIAGNOSIS — Z170 Estrogen receptor positive status [ER+]: Secondary | ICD-10-CM

## 2021-11-21 ENCOUNTER — Other Ambulatory Visit
Admission: RE | Admit: 2021-11-21 | Discharge: 2021-11-21 | Disposition: A | Discharge status: Home / Self Care | Payer: BC Managed Care – PPO | Source: Ambulatory Visit | Attending: General Surgery | Admitting: General Surgery

## 2021-11-21 ENCOUNTER — Other Ambulatory Visit: Payer: Self-pay

## 2021-11-21 NOTE — Patient Instructions (Signed)
?Your procedure is scheduled on: Friday November 22, 2021. ?Report to Day Surgery inside [facility] 2nd floor, stop by admissions desk before getting on elevator. ?To find out your arrival time please call [phone] between 1PM - 3PM on Thursday November 21, 2021. ? ?Remember: Instructions that are not followed completely may result in serious medical risk,  ?up to and including death, or upon the discretion of your surgeon and anesthesiologist your  ?surgery may need to be rescheduled.  ? ?  _X__ 1. Do not eat food or drink fluids after midnight the night before your procedure. ?                No chewing gum or hard candies. ? ?__X__2.  On the morning of surgery brush your teeth with toothpaste and water, you ?               may rinse your mouth with mouthwash if you wish.  Do not swallow any toothpaste or mouthwash. ?   ? _X__ 3.  No Alcohol for 24 hours before or after surgery. ? ? _X__ 4.  Do Not Smoke or use e-cigarettes For 24 Hours Prior to Your Surgery. ?                Do not use any chewable tobacco products for at least 6 hours prior to ?                Surgery. ? ?_X__  5.  Do not use any recreational drugs (marijuana, cocaine, heroin, ecstasy, MDMA or other) ?               For at least one week prior to your surgery.  Combination of these drugs with anesthesia ?               May have life threatening results. ? ?____  6.  Bring all medications with you on the day of surgery if instructed.  ? ?__X__  7.  Notify your doctor if there is any change in your medical condition  ?    (cold, fever, infections). ?    ?Do not wear jewelry, make-up, hairpins, clips or nail polish. ?Do not wear lotions, powders, or perfumes. You may wear deodorant. ?Do not shave 48 hours prior to surgery. Men may shave face and neck. ?Do not bring valuables to the hospital.   ? ?[facility] is not responsible for any belongings or valuables. ? ?Contacts, dentures or bridgework may not be worn into  surgery. ?Leave your suitcase in the car. After surgery it may be brought to your room. ?For patients admitted to the hospital, discharge time is determined by your ?treatment team. ?  ?Patients discharged the day of surgery will not be allowed to drive home.   ?Make arrangements for someone to be with you for the first 24 hours of your ?Same Day Discharge. ? ? ? ?__X__ Take these medicines the morning of surgery with A SIP OF WATER:  ? ? 1. atorvastatin (LIPITOR) 80 MG  ? 2. escitalopram (LEXAPRO) 20  ? 3. levothyroxine (SYNTHROID) 150 MCG ? 4. metoprolol succinate (TOPROL-XL) 25 MG 24 ? 5. ? 6. ? ?____ Fleet Enema (as directed)  ? ?__X__ Use CHG Soap (or wipes) as directed ? ?____ Use Benzoyl Peroxide Gel as instructed ? ?____ Use inhalers on the day of surgery ? ?____ Stop metformin 2 days prior to surgery   ? ?____ Take 1/2 of usual insulin  dose the night before surgery. No insulin the morning ?         of surgery.  ? ?____ Call your PCP, cardiologist, or Pulmonologist if taking Coumadin/Plavix/aspirin and ask when to stop before your surgery.  ? ?__X__ One Week prior to surgery- Stop Anti-inflammatories such as Ibuprofen, Aleve, Advil, Motrin, meloxicam (MOBIC), diclofenac, etodolac, ketorolac, Toradol, Daypro, piroxicam, Goody's or BC powders. OK TO USE TYLENOL IF NEEDED ?  ?__X__ Stop supplements until after surgery.   ? ?____ Bring C-Pap to the hospital.  ? ? ?If you have any questions regarding your pre-procedure instructions,  ?Please call Pre-admit Testing at [phone] ?

## 2021-11-21 NOTE — Patient Instructions (Signed)
?Your procedure is scheduled on: Friday November 22, 2021. ?Report to Day Surgery inside [facility] 2nd floor, stop by admissions desk before getting on elevator.  ?To find out your arrival time please call [phone] between 1PM - 3PM on Thursday November 21, 2021. ? ?Remember: Instructions that are not followed completely may result in serious medical risk,  ?up to and including death, or upon the discretion of your surgeon and anesthesiologist your  ?surgery may need to be rescheduled.  ? ?  _X__ 1. Do not eat food after midnight the night before your procedure. ?                No chewing gum or hard candies. You may drink clear liquids up to 2 hours ?                before you are scheduled to arrive for your surgery- DO not drink clear ?                liquids within 2 hours of the start of your surgery. ?                Clear Liquids include:  water, apple juice without pulp, clear Gatorade, G2 or  ?                Gatorade Zero (avoid Red/Purple/Blue), Black Coffee or Tea (Do not add ?                anything to coffee or tea). ? ?__X__2.  On the morning of surgery brush your teeth with toothpaste and water, you ?               may rinse your mouth with mouthwash if you wish.  Do not swallow any toothpaste of mouthwash. ?   ? _X__ 3.  No Alcohol for 24 hours before or after surgery. ? ? _X__ 4.  Do Not Smoke or use e-cigarettes For 24 Hours Prior to Your Surgery. ?                Do not use any chewable tobacco products for at least 6 hours prior to ?                Surgery. ? ?_X__  5.  Do not use any recreational drugs (marijuana, cocaine, heroin, ecstasy, MDMA or other) ?               For at least one week prior to your surgery.  Combination of these drugs with anesthesia ?               May have life threatening results. ? ?____  6.  Bring all medications with you on the day of surgery if instructed.  ? ?__X__  7.  Notify your doctor if there is any change in your medical condition   ?    (cold, fever, infections). ?    ?Do not wear jewelry, make-up, hairpins, clips or nail polish. ?Do not wear lotions, powders, or perfumes. You may wear deodorant. ?Do not shave 48 hours prior to surgery. Men may shave face and neck. ?Do not bring valuables to the hospital.   ? ?[facility] is not responsible for any belongings or valuables. ? ?Contacts, dentures or bridgework may not be worn into surgery. ?Leave your suitcase in the car. After surgery it may be brought to your room. ?For patients admitted to the hospital, discharge time is  determined by your ?treatment team. ?  ?Patients discharged the day of surgery will not be allowed to drive home.   ?Make arrangements for someone to be with you for the first 24 hours of your ?Same Day Discharge. ? ? ?_X___ Take these medicines the morning of surgery with A SIP OF WATER:  ? ? 1. atorvastatin (LIPITOR) 80 MG ? 2. escitalopram (LEXAPRO) 20 MG ? 3. levothyroxine (SYNTHROID) 150 MCG ? 4. metoprolol succinate (TOPROL-XL) 25 MG ? 5. ? 6. ? ?____ Fleet Enema (as directed)  ? ?__X__ Use CHG Soap (or wipes) as directed ? ?____ Use Benzoyl Peroxide Gel as instructed ? ?____ Use inhalers on the day of surgery ? ?____ Stop metformin 2 days prior to surgery   ? ?____ Take 1/2 of usual insulin dose the night before surgery. No insulin the morning ?         of surgery.  ? ?____ Call your PCP, cardiologist, or Pulmonologist if taking Coumadin/Plavix/aspirin and ask when to stop before your surgery.  ? ?__X__ One Week prior to surgery- Stop Anti-inflammatories such as Ibuprofen, Aleve, Advil, Motrin, meloxicam (MOBIC), diclofenac, etodolac, ketorolac, Toradol, Daypro, piroxicam, Goody's or BC powders. OK TO USE TYLENOL IF NEEDED ?  ?__X__ Stop supplements until after surgery.   ? ?____ Bring C-Pap to the hospital.  ? ? ?If you have any questions regarding your pre-procedure instructions,  ?Please call Pre-admit Testing at [phone] ?

## 2021-11-22 ENCOUNTER — Ambulatory Visit
Admission: RE | Admit: 2021-11-22 | Discharge: 2021-11-22 | Disposition: A | Discharge status: Home / Self Care | Payer: BC Managed Care – PPO | Source: Ambulatory Visit | Attending: General Surgery | Admitting: General Surgery

## 2021-11-22 ENCOUNTER — Ambulatory Visit: Payer: BC Managed Care – PPO | Admitting: Registered Nurse

## 2021-11-22 ENCOUNTER — Ambulatory Visit
Admission: RE | Admit: 2021-11-22 | Discharge: 2021-11-22 | Disposition: A | Discharge status: Home / Self Care | Payer: BC Managed Care – PPO | Attending: General Surgery | Admitting: General Surgery

## 2021-11-22 ENCOUNTER — Other Ambulatory Visit: Payer: Self-pay

## 2021-11-22 ENCOUNTER — Encounter
Admission: RE | Disposition: A | Discharge status: Home / Self Care | Payer: Self-pay | Source: Home / Self Care | Attending: General Surgery

## 2021-11-22 ENCOUNTER — Encounter: Payer: Self-pay | Admitting: General Surgery

## 2021-11-22 DIAGNOSIS — E039 Hypothyroidism, unspecified: Secondary | ICD-10-CM | POA: Diagnosis not present

## 2021-11-22 DIAGNOSIS — Z170 Estrogen receptor positive status [ER+]: Secondary | ICD-10-CM | POA: Insufficient documentation

## 2021-11-22 DIAGNOSIS — E785 Hyperlipidemia, unspecified: Secondary | ICD-10-CM | POA: Diagnosis not present

## 2021-11-22 DIAGNOSIS — K219 Gastro-esophageal reflux disease without esophagitis: Secondary | ICD-10-CM | POA: Diagnosis not present

## 2021-11-22 DIAGNOSIS — C50411 Malignant neoplasm of upper-outer quadrant of right female breast: Secondary | ICD-10-CM | POA: Insufficient documentation

## 2021-11-22 DIAGNOSIS — I10 Essential (primary) hypertension: Secondary | ICD-10-CM | POA: Insufficient documentation

## 2021-11-22 HISTORY — PX: BREAST LUMPECTOMY WITH SENTINEL LYMPH NODE BIOPSY: SHX5597

## 2021-11-22 SURGERY — BREAST LUMPECTOMY WITH SENTINEL LYMPH NODE BX
Anesthesia: General | Laterality: Right

## 2021-11-22 MED ORDER — CHLORHEXIDINE GLUCONATE CLOTH 2 % EX PADS
6.0000 | MEDICATED_PAD | Freq: Once | CUTANEOUS | Status: AC
  Administered 2021-11-22: 6 via TOPICAL

## 2021-11-22 MED ORDER — KETOROLAC TROMETHAMINE 30 MG/ML IJ SOLN
INTRAMUSCULAR | Status: DC | PRN
  Administered 2021-11-22: 30 mg via INTRAVENOUS

## 2021-11-22 MED ORDER — HYDROCODONE-ACETAMINOPHEN 5-325 MG PO TABS: 1.0000 | ORAL_TABLET | ORAL | 0 refills | Status: DC | PRN

## 2021-11-22 MED ORDER — FENTANYL CITRATE (PF) 100 MCG/2ML IJ SOLN
INTRAMUSCULAR | Status: AC
  Filled 2021-11-22: qty 2

## 2021-11-22 MED ORDER — FENTANYL CITRATE (PF) 100 MCG/2ML IJ SOLN
25.0000 ug | INTRAMUSCULAR | Status: DC | PRN
  Administered 2021-11-22: 25 ug via INTRAVENOUS

## 2021-11-22 MED ORDER — SUGAMMADEX SODIUM 200 MG/2ML IV SOLN
INTRAVENOUS | Status: DC | PRN
  Administered 2021-11-22: 200 mg via INTRAVENOUS

## 2021-11-22 MED ORDER — ACETAMINOPHEN 10 MG/ML IV SOLN
INTRAVENOUS | Status: AC
  Filled 2021-11-22: qty 100

## 2021-11-22 MED ORDER — METHYLENE BLUE 0.5 % INJ SOLN
INTRAVENOUS | Status: DC | PRN
  Administered 2021-11-22: 4 mL via INTRADERMAL

## 2021-11-22 MED ORDER — PENTAFLUOROPROP-TETRAFLUOROETH EX AERO
INHALATION_SPRAY | CUTANEOUS | Status: AC
  Filled 2021-11-22: qty 30

## 2021-11-22 MED ORDER — PROPOFOL 10 MG/ML IV BOLUS
INTRAVENOUS | Status: AC
  Filled 2021-11-22: qty 20

## 2021-11-22 MED ORDER — METHYLENE BLUE 0.5 % INJ SOLN
INTRAVENOUS | Status: AC
Start: 2021-11-22 — End: ?
  Filled 2021-11-22: qty 10

## 2021-11-22 MED ORDER — TECHNETIUM TC 99M TILMANOCEPT KIT
1.0000 | PACK | Freq: Once | INTRAVENOUS | Status: AC | PRN
  Administered 2021-11-22: 0.992 via INTRADERMAL

## 2021-11-22 MED ORDER — LIDOCAINE HCL (CARDIAC) PF 100 MG/5ML IV SOSY
PREFILLED_SYRINGE | INTRAVENOUS | Status: DC | PRN
  Administered 2021-11-22: 100 mg via INTRAVENOUS

## 2021-11-22 MED ORDER — CHLORHEXIDINE GLUCONATE 0.12 % MT SOLN: 15.0000 mL | Freq: Once | OROMUCOSAL | Status: AC

## 2021-11-22 MED ORDER — CHLORHEXIDINE GLUCONATE 0.12 % MT SOLN
OROMUCOSAL | Status: AC
  Filled 2021-11-22: qty 15

## 2021-11-22 MED ORDER — EPHEDRINE SULFATE (PRESSORS) 50 MG/ML IJ SOLN
INTRAMUSCULAR | Status: DC | PRN
  Administered 2021-11-22: 10 mg via INTRAVENOUS

## 2021-11-22 MED ORDER — ROCURONIUM BROMIDE 100 MG/10ML IV SOLN
INTRAVENOUS | Status: DC | PRN
  Administered 2021-11-22: 40 mg via INTRAVENOUS
  Administered 2021-11-22: 20 mg via INTRAVENOUS

## 2021-11-22 MED ORDER — ORAL CARE MOUTH RINSE
15.0000 mL | Freq: Once | OROMUCOSAL | Status: AC
  Administered 2021-11-22: 15 mL via OROMUCOSAL

## 2021-11-22 MED ORDER — DEXAMETHASONE SODIUM PHOSPHATE 10 MG/ML IJ SOLN
INTRAMUSCULAR | Status: DC | PRN
  Administered 2021-11-22: 10 mg via INTRAVENOUS

## 2021-11-22 MED ORDER — FAMOTIDINE 20 MG PO TABS: 20.0000 mg | ORAL_TABLET | Freq: Once | ORAL | Status: AC

## 2021-11-22 MED ORDER — OXYCODONE HCL 5 MG PO TABS
ORAL_TABLET | ORAL | Status: AC
  Filled 2021-11-22: qty 1

## 2021-11-22 MED ORDER — MIDAZOLAM HCL 2 MG/2ML IJ SOLN
INTRAMUSCULAR | Status: DC | PRN
  Administered 2021-11-22: 2 mg via INTRAVENOUS

## 2021-11-22 MED ORDER — OXYCODONE HCL 5 MG/5ML PO SOLN: 5.0000 mg | Freq: Once | ORAL | Status: AC | PRN

## 2021-11-22 MED ORDER — STERILE WATER FOR IRRIGATION IR SOLN
Status: DC | PRN
  Administered 2021-11-22: 200 mL

## 2021-11-22 MED ORDER — BUPIVACAINE-EPINEPHRINE (PF) 0.5% -1:200000 IJ SOLN
INTRAMUSCULAR | Status: AC
  Filled 2021-11-22: qty 30

## 2021-11-22 MED ORDER — MIDAZOLAM HCL 2 MG/2ML IJ SOLN
INTRAMUSCULAR | Status: AC
  Filled 2021-11-22: qty 2

## 2021-11-22 MED ORDER — CHLORHEXIDINE GLUCONATE CLOTH 2 % EX PADS: 6.0000 | MEDICATED_PAD | Freq: Once | CUTANEOUS | Status: AC

## 2021-11-22 MED ORDER — LACTATED RINGERS IV SOLN: INTRAVENOUS | Status: DC

## 2021-11-22 MED ORDER — BUPIVACAINE-EPINEPHRINE (PF) 0.5% -1:200000 IJ SOLN
INTRAMUSCULAR | Status: DC | PRN
  Administered 2021-11-22: 10 mL
  Administered 2021-11-22: 15 mL
  Administered 2021-11-22: 5 mL

## 2021-11-22 MED ORDER — FAMOTIDINE 20 MG PO TABS
ORAL_TABLET | ORAL | Status: AC
  Administered 2021-11-22: 20 mg via ORAL
  Filled 2021-11-22: qty 1

## 2021-11-22 MED ORDER — PHENYLEPHRINE 40 MCG/ML (10ML) SYRINGE FOR IV PUSH (FOR BLOOD PRESSURE SUPPORT)
PREFILLED_SYRINGE | INTRAVENOUS | Status: DC | PRN
  Administered 2021-11-22 (×3): 80 ug via INTRAVENOUS

## 2021-11-22 MED ORDER — SUCCINYLCHOLINE CHLORIDE 200 MG/10ML IV SOSY
PREFILLED_SYRINGE | INTRAVENOUS | Status: DC | PRN
  Administered 2021-11-22: 100 mg via INTRAVENOUS

## 2021-11-22 MED ORDER — OXYCODONE HCL 5 MG PO TABS
5.0000 mg | ORAL_TABLET | Freq: Once | ORAL | Status: AC | PRN
  Administered 2021-11-22: 5 mg via ORAL

## 2021-11-22 MED ORDER — PROPOFOL 10 MG/ML IV BOLUS
INTRAVENOUS | Status: DC | PRN
  Administered 2021-11-22 (×2): 150 mg via INTRAVENOUS
  Administered 2021-11-22: 50 mg via INTRAVENOUS

## 2021-11-22 MED ORDER — ACETAMINOPHEN 10 MG/ML IV SOLN
INTRAVENOUS | Status: DC | PRN
  Administered 2021-11-22: 1000 mg via INTRAVENOUS

## 2021-11-22 MED ORDER — PHENYLEPHRINE HCL (PRESSORS) 10 MG/ML IV SOLN
INTRAVENOUS | Status: AC
  Filled 2021-11-22: qty 1

## 2021-11-22 MED ORDER — ONDANSETRON HCL 4 MG/2ML IJ SOLN
INTRAMUSCULAR | Status: DC | PRN
  Administered 2021-11-22: 4 mg via INTRAVENOUS

## 2021-11-22 MED ORDER — FENTANYL CITRATE (PF) 100 MCG/2ML IJ SOLN
INTRAMUSCULAR | Status: DC | PRN
  Administered 2021-11-22 (×3): 50 ug via INTRAVENOUS

## 2021-11-22 SURGICAL SUPPLY — 48 items
APL PRP STRL LF DISP 70% ISPRP (MISCELLANEOUS) ×1
BINDER BREAST LRG (GAUZE/BANDAGES/DRESSINGS) ×1 IMPLANT
BLADE SURG 15 STRL SS SAFETY (BLADE) ×4 IMPLANT
CHLORAPREP W/TINT 26 (MISCELLANEOUS) ×2 IMPLANT
COVER PROBE FLX POLY STRL (MISCELLANEOUS) ×2 IMPLANT
DEVICE DUBIN SPECIMEN MAMMOGRA (MISCELLANEOUS) ×2 IMPLANT
DRAPE LAPAROTOMY TRNSV 106X77 (MISCELLANEOUS) ×2 IMPLANT
DRSG GAUZE FLUFF 36X18 (GAUZE/BANDAGES/DRESSINGS) ×4 IMPLANT
DRSG TELFA 3X8 NADH (GAUZE/BANDAGES/DRESSINGS) ×2 IMPLANT
ELECT CAUTERY BLADE TIP 2.5 (TIP) ×2
ELECT REM PT RETURN 9FT ADLT (ELECTROSURGICAL) ×2
ELECTRODE CAUTERY BLDE TIP 2.5 (TIP) ×1 IMPLANT
ELECTRODE REM PT RTRN 9FT ADLT (ELECTROSURGICAL) ×1 IMPLANT
GAUZE 4X4 16PLY ~~LOC~~+RFID DBL (SPONGE) ×2 IMPLANT
GLOVE SURG ENC MOIS LTX SZ7.5 (GLOVE) ×2 IMPLANT
GLOVE SURG UNDER LTX SZ8 (GLOVE) ×2 IMPLANT
GOWN STRL REUS W/ TWL LRG LVL3 (GOWN DISPOSABLE) ×2 IMPLANT
GOWN STRL REUS W/TWL LRG LVL3 (GOWN DISPOSABLE) ×4
KIT TURNOVER KIT A (KITS) ×2 IMPLANT
LABEL OR SOLS (LABEL) ×2 IMPLANT
MANIFOLD NEPTUNE II (INSTRUMENTS) ×2 IMPLANT
MARGIN MAP 10MM (MISCELLANEOUS) ×2 IMPLANT
NDL HYPO 25X1 1.5 SAFETY (NEEDLE) ×2 IMPLANT
NEEDLE HYPO 22GX1.5 SAFETY (NEEDLE) ×2 IMPLANT
NEEDLE HYPO 25X1 1.5 SAFETY (NEEDLE) ×4 IMPLANT
PACK BASIN MINOR ARMC (MISCELLANEOUS) ×2 IMPLANT
PAD DRESSING TELFA 3X8 NADH (GAUZE/BANDAGES/DRESSINGS) ×1 IMPLANT
PENCIL ELECTRO HAND CTR (MISCELLANEOUS) ×2 IMPLANT
SLEVE PROBE SENORX GAMMA FIND (MISCELLANEOUS) ×1 IMPLANT
STRIP CLOSURE SKIN 1/2X4 (GAUZE/BANDAGES/DRESSINGS) ×3 IMPLANT
SUT ETHILON 3-0 FS-10 30 BLK (SUTURE) ×2
SUT SILK 2 0 (SUTURE) ×2
SUT SILK 2-0 18XBRD TIE 12 (SUTURE) ×1 IMPLANT
SUT VIC AB 2-0 CT1 27 (SUTURE) ×6
SUT VIC AB 2-0 CT1 TAPERPNT 27 (SUTURE) ×2 IMPLANT
SUT VIC AB 3-0 54X BRD REEL (SUTURE) ×1 IMPLANT
SUT VIC AB 3-0 BRD 54 (SUTURE) ×2
SUT VIC AB 3-0 SH 27 (SUTURE) ×4
SUT VIC AB 3-0 SH 27X BRD (SUTURE) ×2 IMPLANT
SUT VIC AB 4-0 FS2 27 (SUTURE) ×4 IMPLANT
SUTURE EHLN 3-0 FS-10 30 BLK (SUTURE) ×1 IMPLANT
SWABSTK COMLB BENZOIN TINCTURE (MISCELLANEOUS) ×2 IMPLANT
SYR 10ML LL (SYRINGE) ×2 IMPLANT
SYR BULB IRRIG 60ML STRL (SYRINGE) ×2 IMPLANT
TAPE TRANSPORE STRL 2 31045 (GAUZE/BANDAGES/DRESSINGS) ×2 IMPLANT
TRAP NEPTUNE SPECIMEN COLLECT (MISCELLANEOUS) ×2 IMPLANT
WATER STERILE IRR 1000ML POUR (IV SOLUTION) ×2 IMPLANT
WATER STERILE IRR 500ML POUR (IV SOLUTION) ×2 IMPLANT

## 2021-11-22 NOTE — Anesthesia Postprocedure Evaluation (Signed)
Anesthesia Post Note ? ?Patient: Lori Castro ? ?Procedure(s) Performed: BREAST LUMPECTOMY WITH SENTINEL LYMPH NODE BX (Right) ? ?Patient location during evaluation: PACU ?Anesthesia Type: General ?Level of consciousness: awake and alert ?Pain management: pain level controlled ?Vital Signs Assessment: post-procedure vital signs reviewed and stable ?Respiratory status: spontaneous breathing, nonlabored ventilation, respiratory function stable and patient connected to nasal cannula oxygen ?Cardiovascular status: blood pressure returned to baseline and stable ?Postop Assessment: no apparent nausea or vomiting ?Anesthetic complications: no ? ? ?No notable events documented. ? ? ?Last Vitals:  ?Vitals:  ? 11/22/21 1130 11/22/21 1138  ?BP: 123/90 (!) 151/87  ?Pulse: 97 98  ?Resp: 20 18  ?Temp: 36.8 ?C 36.8 ?C  ?SpO2: 95% 93%  ?  ?Last Pain:  ?Vitals:  ? 11/22/21 1138  ?TempSrc: Temporal  ?PainSc: 0-No pain  ? ? ?  ?  ?  ?  ?  ?  ? ?Precious Haws Jin Capote ? ? ? ? ?

## 2021-11-22 NOTE — Op Note (Signed)
Preoperative diagnosis: Right breast cancer, desire for breast conservation. ? ?Postoperative diagnosis: Same. ? ?Operative procedure: Right breast wide excision with tissue transfer (10 cm?), sentinel node biopsy.  Ultrasound guidance. ? ?Operating surgeon: Hervey Ard, MD. ? ?Anesthesia: General endotracheal, Marcaine 0.5% with 1: 200,000 units of epinephrine. ? ?Estimated blood loss: Less than 5 cc. ? ?Clinical note: This 56 year old woman was recently diagnosed with a small invasive mammary carcinoma the right breast.  She desired breast conservation.  She underwent sentinel node injection the morning of the procedure.  No antibiotics were indicated.  SCD stockings for DVT prevention. ? ?Operative note: With the patient under adequate general anesthesia t the tissue around the nipple areolar complex was cleansed with alcohol followed by the injection of 4 cc of 0.5% methylene blue.  The breast, chest and neck was cleansed with ChloraPrep and draped.  Ultrasound was used to identify the tumor mass in the upper outer quadrant of the right breast.  Image placed in epic under media.  A curvilinear incision in the upper outer quadrant was made after instillation of local anesthesia.  The skin was incised sharply.  The subcutaneous fat was elevated off the underlying tumor mass and a 2 x 2 x 3 cm block of tissue was orientated and specimen radiograph confirmed the clip in the center of the specimen. ? ?While the tissue was being processed by pathology attention was turned to the axilla.  Making use of the node seeker the area of increased uptake was identified.  Local anesthesia was infiltrated and a small transverse incision was made.  Skin was incised sharply and wound deepened with electrocautery to the axillary envelope.  2-7 mm lymph nodes were identified that were "hot".  1 was blue stained.  Additional area of fat in the axilla had been identified with increased uptake in vivo but was not present ex vivo.   This was sent separately as axillary fat.  Scanning through the axilla showed no other areas of increased uptake.  The hot, blue node had counts of 15,000, the hot node counts of 6000. ? ?After assuring hemostasis the axillary envelope was closed with multiple layers of 2-0 Vicryl figure-of-eight sutures.  The skin was closed with a running 4-0 Vicryl subcuticular suture. ? ?The pathology service reported the margins were clear.  The breast parenchyma was elevated off the underlying muscle and approximately with interrupted 2-0 Vicryl figure-of-eight sutures in multiple layers.  In the inferior lateral aspect it was necessary to mobilize the skin to obtain a smooth contour.  Approximately 10 cm? area was involved in tissue transfer.  The skin was then closed with a running 4-0 Vicryl subcuticular suture.  Benzoin, Steri-Strips, Telfa and fluff gauze were applied.  Compressive wrap placed. ? ?The patient was transferred to the PACU in stable condition. ? ? ?

## 2021-11-22 NOTE — Progress Notes (Signed)
?   11/22/21 0730  ?Clinical Encounter Type  ?Visited With Patient  ?Visit Type Initial;Pre-op  ?Spiritual Encounters  ?Spiritual Needs Prayer  ? ?Chaplain provided support for patient and visitor through meaningful compassionate presence and prayer. ?

## 2021-11-22 NOTE — Discharge Instructions (Signed)
AMBULATORY SURGERY  ?DISCHARGE INSTRUCTIONS ? ? ?The drugs that you were given will stay in your system until tomorrow so for the next 24 hours you should not: ? ?Drive an automobile ?Make any legal decisions ?Drink any alcoholic beverage ? ? ?You may resume regular meals tomorrow.  Today it is better to start with liquids and gradually work up to solid foods. ? ?You may eat anything you prefer, but it is better to start with liquids, then soup and crackers, and gradually work up to solid foods. ? ? ?Please notify your doctor immediately if you have any unusual bleeding, trouble breathing, redness and pain at the surgery site, drainage, fever, or pain not relieved by medication. ? ? ? ?Additional Instructions: ? ? ? ?Please contact your physician with any problems or Same Day Surgery at [phone], Monday through Friday 6 am to 4 pm, or [facility] number at [phone].  ?

## 2021-11-22 NOTE — Anesthesia Procedure Notes (Signed)
Procedure Name: Intubation ?Date/Time: 11/22/2021 9:25 AM ?Performed by: Nelda Marseille, CRNA ?Pre-anesthesia Checklist: Patient identified, Patient being monitored, Timeout performed, Emergency Drugs available and Suction available ?Patient Re-evaluated:Patient Re-evaluated prior to induction ?Oxygen Delivery Method: Circle system utilized ?Preoxygenation: Pre-oxygenation with 100% oxygen ?Induction Type: IV induction ?Ventilation: Mask ventilation without difficulty ?Laryngoscope Size: Mac, 3 and McGraph ?Grade View: Grade I ?Tube type: Oral ?Tube size: 7.0 mm ?Number of attempts: 1 ?Airway Equipment and Method: Stylet ?Placement Confirmation: ETT inserted through vocal cords under direct vision, positive ETCO2 and breath sounds checked- equal and bilateral ?Secured at: 20 cm ?Tube secured with: Tape ?Dental Injury: Teeth and Oropharynx as per pre-operative assessment  ? ? ? ? ?

## 2021-11-22 NOTE — Transfer of Care (Signed)
Immediate Anesthesia Transfer of Care Note ? ?Patient: Lori Castro ? ?Procedure(s) Performed: BREAST LUMPECTOMY WITH SENTINEL LYMPH NODE BX (Right) ? ?Patient Location: PACU ? ?Anesthesia Type:General ? ?Level of Consciousness: awake, alert  and oriented ? ?Airway & Oxygen Therapy: Patient Spontanous Breathing and Patient connected to face mask oxygen ? ?Post-op Assessment: Report given to RN and Post -op Vital signs reviewed and stable ? ?Post vital signs: Reviewed and stable ? ?Last Vitals:  ?Vitals Value Taken Time  ?BP 165/90 11/22/21 1056  ?Temp    ?Pulse 102 11/22/21 1100  ?Resp 15 11/22/21 1100  ?SpO2 88 % 11/22/21 1100  ?Vitals shown include unvalidated device data. ? ?Last Pain:  ?Vitals:  ? 11/22/21 0718  ?TempSrc: Oral  ?   ? ?Patients Stated Pain Goal: 0 (11/22/21 1595) ? ?Complications: No notable events documented. ?

## 2021-11-22 NOTE — H&P (Signed)
Lori Castro ?[id number] ?11-21-1965 ? ?  ? ?HPI: Healthy 56 yo with early stage breast cancer for surgical excision.  Vagal response to IV start.  ? ?Medications Prior to Admission  ?Medication Sig Dispense Refill Last Dose  ? atorvastatin (LIPITOR) 80 MG tablet Take 80 mg by mouth daily.   11/21/2021  ? cyclobenzaprine (FLEXERIL) 10 MG tablet Take 10 mg by mouth 3 (three) times daily as needed for muscle spasms.   Past Month  ? escitalopram (LEXAPRO) 20 MG tablet Take 20 mg by mouth daily.   11/22/2021  ? levothyroxine (SYNTHROID) 150 MCG tablet Take 150 mcg by mouth daily before breakfast.   11/22/2021  ? losartan (COZAAR) 100 MG tablet Take 100 mg by mouth daily.   11/21/2021  ? metoprolol succinate (TOPROL-XL) 25 MG 24 hr tablet Take 25 mg by mouth daily.   11/22/2021  ? ?No Known Allergies ?Past Medical History:  ?Diagnosis Date  ? ADHD   ? Breast cancer ([place])   ? Cancer [facility])   ? Thyroid  ? DDD (degenerative disc disease), lumbar   ? GERD (gastroesophageal reflux disease)   ? Hyperlipemia   ? Hypertension   ? Hypothyroidism   ? IBS (irritable bowel syndrome)   ? OSA (obstructive sleep apnea)   ? Papillary thyroid carcinoma ([place])   ? Seasonal allergies   ? ?Past Surgical History:  ?Procedure Laterality Date  ? CESAREAN SECTION    ? COLONOSCOPY  04/13/2020  ? NEUROPLASTY VOCAL CORD  2016  ? R Long Trigger Finger Release  03/24/2018  ? THYROID SURGERY  09/16/2013  ? THYROIDECTOMY TOTAL  ? ultrasound guided core breast biopsy  11/15/2021  ? ?Social History  ? ?Socioeconomic History  ? Marital status: Married  ?  Spouse name: Not on file  ? Number of children: Not on file  ? Years of education: Not on file  ? Highest education level: Not on file  ?Occupational History  ? Not on file  ?Tobacco Use  ? Smoking status: Never  ? Smokeless tobacco: Never  ?Vaping Use  ? Vaping Use: Never used  ?Substance and Sexual Activity  ? Alcohol use: Yes  ?  Comment: occasional on the weekend  ? Drug use: No  ? Sexual activity: Not on  file  ?Other Topics Concern  ? Not on file  ?Social History Narrative  ? Not on file  ? ?Social Determinants of Health  ? ?Financial Resource Strain: Not on file  ?Food Insecurity: Not on file  ?Transportation Needs: Not on file  ?Physical Activity: Not on file  ?Stress: Not on file  ?Social Connections: Not on file  ?Intimate Partner Violence: Not on file  ? ?Social History  ? ?Social History Narrative  ? Not on file  ? ? ? ?ROS: Negative.  ? ? ? ?PE: ?HEENT: Negative. ?Lungs: Clear. ?Cardio: RR. ? ? ? ?Assessment/Plan: ? ?Proceed with planned excision right breast cancer and SLN exam.  ?Lori Castro Lori Castro ?11/22/2021 ? ?  ?

## 2021-11-22 NOTE — Anesthesia Preprocedure Evaluation (Signed)
Anesthesia Evaluation  ?Patient identified by MRN, date of birth, ID band ?Patient awake ? ? ? ?Reviewed: ?Allergy & Precautions, NPO status , Patient's Chart, lab work & pertinent test results ? ?History of Anesthesia Complications ?Negative for: history of anesthetic complications ? ?Airway ?Mallampati: III ? ?TM Distance: <3 FB ?Neck ROM: full ? ? ? Dental ? ?(+) Chipped, Poor Dentition, Missing ?  ?Pulmonary ?neg shortness of breath, sleep apnea ,  ?  ?Pulmonary exam normal ? ? ? ? ? ? ? Cardiovascular ?Exercise Tolerance: Good ?hypertension, (-) angina(-) Past MI Normal cardiovascular exam ? ? ?  ?Neuro/Psych ?PSYCHIATRIC DISORDERS negative neurological ROS ?   ? GI/Hepatic ?Neg liver ROS, GERD  Controlled,  ?Endo/Other  ?Hypothyroidism  ? Renal/GU ?  ? ?  ?Musculoskeletal ? ?(+) Arthritis ,  ? Abdominal ?  ?Peds ? Hematology ?negative hematology ROS ?(+)   ?Anesthesia Other Findings ?Past Medical History: ?No date: ADHD ?No date: Breast cancer ([place]) ?No date: Cancer [facility]) ?    Comment:  Thyroid ?No date: DDD (degenerative disc disease), lumbar ?No date: GERD (gastroesophageal reflux disease) ?No date: Hyperlipemia ?No date: Hypertension ?No date: Hypothyroidism ?No date: IBS (irritable bowel syndrome) ?No date: OSA (obstructive sleep apnea) ?No date: Papillary thyroid carcinoma ([place]) ?No date: Seasonal allergies ? ?Past Surgical History: ?No date: CESAREAN SECTION ?04/13/2020: COLONOSCOPY ?2016: NEUROPLASTY VOCAL CORD ?03/24/2018: R Long Trigger Finger Release ?09/16/2013: THYROID SURGERY ?    Comment:  THYROIDECTOMY TOTAL ?11/15/2021: ultrasound guided core breast biopsy ? ?BMI   ? Body Mass Index: 29.13 kg/m?  ?  ? ? Reproductive/Obstetrics ?negative OB ROS ? ?  ? ? ? ? ? ? ? ? ? ? ? ? ? ?  ?  ? ? ? ? ? ? ? ? ?Anesthesia Physical ?Anesthesia Plan ? ?ASA: 3 ? ?Anesthesia Plan: General LMA  ? ?Post-op Pain Management:   ? ?Induction: Intravenous ? ?PONV Risk Score and Plan:  Dexamethasone, Ondansetron, Midazolam and Treatment may vary due to age or medical condition ? ?Airway Management Planned: LMA ? ?Additional Equipment:  ? ?Intra-op Plan:  ? ?Post-operative Plan: Extubation in OR ? ?Informed Consent: I have reviewed the patients History and Physical, chart, labs and discussed the procedure including the risks, benefits and alternatives for the proposed anesthesia with the patient or authorized representative who has indicated his/her understanding and acceptance.  ? ? ? ?Dental Advisory Given ? ?Plan Discussed with: Anesthesiologist, CRNA and Surgeon ? ?Anesthesia Plan Comments: (Patient consented for risks of anesthesia including but not limited to:  ?- adverse reactions to medications ?- damage to eyes, teeth, lips or other oral mucosa ?- nerve damage due to positioning  ?- sore throat or hoarseness ?- Damage to heart, brain, nerves, lungs, other parts of body or loss of life ? ?Patient voiced understanding.)  ? ? ? ? ? ? ?Anesthesia Quick Evaluation ? ?

## 2021-11-23 ENCOUNTER — Encounter: Payer: Self-pay | Admitting: General Surgery

## 2021-11-25 ENCOUNTER — Telehealth: Payer: Self-pay

## 2021-11-26 ENCOUNTER — Other Ambulatory Visit: Payer: Self-pay | Admitting: Anatomic Pathology & Clinical Pathology

## 2021-11-26 LAB — SURGICAL PATHOLOGY

## 2021-12-01 NOTE — Progress Notes (Signed)
?[place]  ?[phone] [phone] ? ?ID: Lori Castro OB: 1965-12-14  MR#: [id number]  BOF#:[id number] ? ?Patient Care Team: ?Adin Hector, MD as PCP - General (Internal Medicine) ?Theodore Demark, RN as Oncology Nurse Navigator ?Lloyd Huger, MD as Consulting Physician (Oncology) ?Adin Hector, MD as Referring Physician (Internal Medicine) ?Noreene Filbert, MD as Consulting Physician (Radiation Oncology) ? 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 ? ?INTERVAL HISTORY: Patient returns to clinic today for further evaluation, discussion of her final pathology results, and treatment planning.  She underwent lumpectomy on November 22, 2021 and tolerated the procedure well.  She currently feels well and is asymptomatic. She has no neurologic complaints.  She denies any recent fevers or illnesses.  She has a good appetite and denies weight loss.  She has no chest pain, shortness of breath, cough, or hemoptysis.  She denies any nausea, vomiting, constipation, or diarrhea.  She has no urinary complaints.  Patient offers no specific complaints today. ? ?REVIEW OF SYSTEMS:   ?Review of Systems  ?Constitutional: Negative.  Negative for fever, malaise/fatigue and weight loss.  ?Respiratory: Negative.  Negative for cough, hemoptysis and shortness of breath.   ?Cardiovascular: Negative.  Negative for chest pain and leg swelling.  ?Gastrointestinal: Negative.  Negative for abdominal pain.  ?Genitourinary: Negative.  Negative for dysuria.  ?Musculoskeletal: Negative.  Negative for back pain.  ?Skin: Negative.  Negative for rash.  ?Neurological: Negative.  Negative for dizziness, focal weakness, weakness and headaches.  ?Psychiatric/Behavioral: Negative.  The patient is not nervous/anxious.   ? ?As per HPI. Otherwise, a complete review of systems is negative. ? ?PAST MEDICAL HISTORY: ?Past  Medical History:  ?Diagnosis Date  ? ADHD   ? Breast cancer ([place])   ? Cancer [facility])   ? Thyroid  ? DDD (degenerative disc disease), lumbar   ? GERD (gastroesophageal reflux disease)   ? Hyperlipemia   ? Hypertension   ? Hypothyroidism   ? IBS (irritable bowel syndrome)   ? OSA (obstructive sleep apnea)   ? Papillary thyroid carcinoma ([place])   ? Seasonal allergies   ? ? ?PAST SURGICAL HISTORY: ?Past Surgical History:  ?Procedure Laterality Date  ? BREAST LUMPECTOMY WITH SENTINEL LYMPH NODE BIOPSY Right 11/22/2021  ? Procedure: BREAST LUMPECTOMY WITH SENTINEL LYMPH NODE BX;  Surgeon: Robert Bellow, MD;  Location: [facility] ORS;  Service: General;  Laterality: Right;  ? CESAREAN SECTION    ? COLONOSCOPY  04/13/2020  ? NEUROPLASTY VOCAL CORD  2016  ? R Long Trigger Finger Release  03/24/2018  ? THYROID SURGERY  09/16/2013  ? THYROIDECTOMY TOTAL  ? ultrasound guided core breast biopsy  11/15/2021  ? ? ?FAMILY HISTORY: ?Family History  ?Problem Relation Age of Onset  ? Hypertension Mother   ? Arthritis Mother   ? Hyperlipidemia Mother   ? Hypertension Father   ? Hyperlipidemia Father   ? Diabetes Father   ? Hyperlipidemia Brother   ? Hypertension Brother   ? Thyroid cancer Paternal Grandmother   ? Hypothyroidism Paternal Grandmother   ? Breast cancer Neg Hx   ? ? ?ADVANCED DIRECTIVES (Y/N):  N ? ?HEALTH MAINTENANCE: ?Social History  ? ?Tobacco Use  ? Smoking status: Never  ? Smokeless tobacco: Never  ?Vaping Use  ? Vaping Use: Never used  ?Substance Use Topics  ? Alcohol use: Yes  ?  Comment: occasional on the weekend  ?  Drug use: No  ? ? ? Colonoscopy: ? PAP: ? Bone density: ? Lipid panel: ? ?No Known Allergies ? ? ?Current Outpatient Medications  ?Medication Sig Dispense Refill  ? atorvastatin (LIPITOR) 80 MG tablet Take 80 mg by mouth daily.    ? escitalopram (LEXAPRO) 20 MG tablet Take 20 mg by mouth daily.    ? HYDROcodone-acetaminophen (NORCO/VICODIN) 5-325 MG tablet Take 1 tablet by mouth every 4 (four) hours as  needed for moderate pain. 12 tablet 0  ? levothyroxine (SYNTHROID) 150 MCG tablet Take 150 mcg by mouth daily before breakfast.    ? losartan (COZAAR) 100 MG tablet Take 100 mg by mouth daily.    ? metoprolol succinate (TOPROL-XL) 25 MG 24 hr tablet Take 25 mg by mouth daily.    ? cyclobenzaprine (FLEXERIL) 10 MG tablet Take 10 mg by mouth 3 (three) times daily as needed for muscle spasms. (Patient not taking: Reported on 12/04/2021)    ? ?No current facility-administered medications for this visit.  ? ? ?OBJECTIVE: ?There were no vitals filed for this visit. ?   There is no height or weight on file to calculate BMI.    ECOG FS:0 - Asymptomatic ? ?General: Well-developed, well-nourished, no acute distress. ?Eyes: Pink conjunctiva, anicteric sclera. ?HEENT: Normocephalic, moist mucous membranes. ?Lungs: No audible wheezing or coughing. ?Heart: Regular rate and rhythm. ?Abdomen: Soft, nontender, no obvious distention. ?Musculoskeletal: No edema, cyanosis, or clubbing. ?Neuro: Alert, answering all questions appropriately. Cranial nerves grossly intact. ?Skin: No rashes or petechiae noted. ?Psych: Normal affect. ? ?LAB RESULTS: ? ?Lab Results  ?Component Value Date  ? Castro 138 09/07/2013  ? K 3.6 09/07/2013  ? CL 103 09/07/2013  ? CO2 32 09/07/2013  ? GLUCOSE 109 (H) 09/07/2013  ? BUN 13 09/07/2013  ? CREATININE 0.80 09/17/2013  ? CALCIUM 8.5 09/17/2013  ? PROT 7.9 09/07/2013  ? ALBUMIN 3.8 09/17/2013  ? AST 47 (H) 09/07/2013  ? ALT 90 (H) 09/07/2013  ? ALKPHOS 106 09/07/2013  ? BILITOT 0.6 09/07/2013  ? GFRNONAA >60 09/17/2013  ? GFRAA >60 09/17/2013  ? ? ?Lab Results  ?Component Value Date  ? WBC 7.4 09/07/2013  ? NEUTROABS 5.5 09/07/2013  ? HGB 12.3 09/07/2013  ? HCT 36.3 09/07/2013  ? MCV 82 09/07/2013  ? PLT 244 09/07/2013  ? ? ? ?STUDIES: ?NM Sentinel Node Inj-No Rpt (Breast) ? ?Result Date: 11/22/2021 ?Sulfur Colloid was injected by the Nuclear Medicine Technologist for sentinel lymph node localization.  ? ?MM Breast  Surgical Specimen ? ?Result Date: 11/25/2021 ?CLINICAL DATA:  Status post RIGHT breast lumpectomy; heart clip demonstrated a invasive mammary carcinoma. EXAM: SPECIMEN RADIOGRAPH OF THE RIGHT BREAST COMPARISON:  Previous exam(s). FINDINGS: Status post excision of the RIGHT breast. The heart shaped biopsy marker clip is present and are marked for pathology. It is located at G6. IMPRESSION: Specimen radiograph of the RIGHT breast. Electronically Signed   By: Valentino Saxon M.D.   On: 11/25/2021 11:30 ? ?MM CLIP PLACEMENT RIGHT ? ?Result Date: 11/12/2021 ?CLINICAL DATA:  Status post ultrasound-guided biopsy right breast mass. EXAM: 3D DIAGNOSTIC RIGHT MAMMOGRAM POST ULTRASOUND BIOPSY COMPARISON:  Previous exam(s). FINDINGS: 3D Mammographic images were obtained following ultrasound guided biopsy of right breast mass. The biopsy marking clip is in expected position at the site of biopsy. IMPRESSION: Appropriate positioning of the heart shaped biopsy marking clip at the site of biopsy in the right breast mass 9:30 o'clock. Final Assessment: Post Procedure Mammograms for Marker Placement Electronically Signed  By: Lovey Newcomer M.D.   On: 11/12/2021 09:53 ? ?[place] RT BREAST BX W LOC DEV 1ST LESION IMG BX SPEC US GUIDE ? ?Addendum Date: 11/14/2021   ?ADDENDUM REPORT: 11/14/2021 15:06 ADDENDUM: PATHOLOGY revealed: A. BREAST, RIGHT AT 930 O'CLOCK; ULTRASOUND-GUIDED CORE NEEDLE BIOPSY: - INVASIVE MAMMARY CARCINOMA, NO SPECIAL TYPE. - CALCIFICATIONS ASSOCIATED WITH NEOPLASTIC MAMMARY ELEMENTS. Size of invasive carcinoma: 9 mm in this sample. Grade 1. Ductal carcinoma in situ: Present, low-grade. Lymphovascular invasion: Not identified. Pathology results are CONCORDANT with imaging findings, per Dr. Lovey Newcomer. Pathology results and recommendations were discussed with patient via telephone on 11/13/2021. Patient reported biopsy site doing well with no adverse symptoms, and only slight tenderness at the site. Post biopsy care  instructions were reviewed, questions were answered and my direct phone number was provided. Patient was instructed to call [facility] for any additional questions or concerns related to biopsy site. [place]

## 2021-12-04 ENCOUNTER — Ambulatory Visit
Admission: RE | Admit: 2021-12-04 | Discharge: 2021-12-04 | Disposition: A | Discharge status: Home / Self Care | Payer: BC Managed Care – PPO | Source: Ambulatory Visit | Attending: Radiation Oncology | Admitting: Radiation Oncology

## 2021-12-04 ENCOUNTER — Inpatient Hospital Stay: Payer: BC Managed Care – PPO | Attending: Oncology | Admitting: Oncology

## 2021-12-04 VITALS — BP 125/79 | HR 96 | Temp 98.6°F | Resp 18 | Ht 65.0 in | Wt 179.5 lb

## 2021-12-04 DIAGNOSIS — E785 Hyperlipidemia, unspecified: Secondary | ICD-10-CM | POA: Insufficient documentation

## 2021-12-04 DIAGNOSIS — Z79899 Other long term (current) drug therapy: Secondary | ICD-10-CM | POA: Insufficient documentation

## 2021-12-04 DIAGNOSIS — Z170 Estrogen receptor positive status [ER+]: Secondary | ICD-10-CM | POA: Insufficient documentation

## 2021-12-04 DIAGNOSIS — Z85850 Personal history of malignant neoplasm of thyroid: Secondary | ICD-10-CM | POA: Diagnosis not present

## 2021-12-04 DIAGNOSIS — E039 Hypothyroidism, unspecified: Secondary | ICD-10-CM | POA: Insufficient documentation

## 2021-12-04 DIAGNOSIS — Z79811 Long term (current) use of aromatase inhibitors: Secondary | ICD-10-CM | POA: Diagnosis not present

## 2021-12-04 DIAGNOSIS — I10 Essential (primary) hypertension: Secondary | ICD-10-CM | POA: Insufficient documentation

## 2021-12-04 DIAGNOSIS — M5136 Other intervertebral disc degeneration, lumbar region: Secondary | ICD-10-CM | POA: Diagnosis not present

## 2021-12-04 DIAGNOSIS — K219 Gastro-esophageal reflux disease without esophagitis: Secondary | ICD-10-CM | POA: Insufficient documentation

## 2021-12-04 DIAGNOSIS — C50411 Malignant neoplasm of upper-outer quadrant of right female breast: Secondary | ICD-10-CM | POA: Insufficient documentation

## 2021-12-04 DIAGNOSIS — K589 Irritable bowel syndrome without diarrhea: Secondary | ICD-10-CM | POA: Diagnosis not present

## 2021-12-04 NOTE — Progress Notes (Signed)
Pt reports soreness in right anterior and posterior axillary area.  ?

## 2021-12-04 NOTE — Consult Note (Signed)
?NEW PATIENT EVALUATION ? ?Name: Lori Castro  ?MRN: [id number]  ?Date:   12/04/2021     ?DOB: 1966-06-05 ? ? ?This 56 y.o. female patient presents to the clinic for initial evaluation of stage Ia (T1 cN0 M0) ER/PR positive invasive mammary carcinoma the right breast status post wide local excision and sentinel node biopsy. ? ?REFERRING PHYSICIAN: Lloyd Huger, MD ? ?CHIEF COMPLAINT:  ?Chief Complaint  ?Patient presents with  ? Consult  ?  Breast  ? ? ?DIAGNOSIS: The encounter diagnosis was Carcinoma of upper-outer quadrant of right female breast, unspecified estrogen receptor status ([place]). ?  ?PREVIOUS INVESTIGATIONS:  ?Mammograms and ultrasound reviewed ?Clinical notes reviewed ?Pathology reports reviewed ? ?HPI: Patient is a 56 year old female who presented with an abnormal mammogram of her right breast.  Tomogram showed a suspicious lesion at the 9:30 position of the right breast measuring 9 x 8 x 8 mm.  Lesion was 4 cm from the nipple confirmed on ultrasound.  She underwent targeted biopsy which was positive for invasive mammary carcinoma.No evidence of adenopathy in the axilla was noted.  Patient underwent a wide local excision and sentinel node biopsy.  Tumor was 15 mm overall grade 1 with margins clear at 3 mm.  4  lymph nodes were examined all negative for metastatic disease.  2 of those nodes were sentinel nodes.  Tumor was ER/PR positive HER2/neu not overexpressed.  She has done well postoperatively still is having some tenderness especially in her right axilla.  She overall specifically denies any other breast masses cough or bone pain.  Oncotype DX has been ordered she is now referred to radiation oncology for consideration of treatment. ? ?PLANNED TREATMENT REGIMEN: Hypofractionated right whole breast radiation ? ?PAST MEDICAL HISTORY:  has a past medical history of ADHD, Breast cancer ([place]), Cancer ([place]), DDD (degenerative disc disease), lumbar, GERD (gastroesophageal reflux disease), Hyperlipemia,  Hypertension, Hypothyroidism, IBS (irritable bowel syndrome), OSA (obstructive sleep apnea), Papillary thyroid carcinoma ([place]), and Seasonal allergies.   ? ?PAST SURGICAL HISTORY:  ?Past Surgical History:  ?Procedure Laterality Date  ? BREAST LUMPECTOMY WITH SENTINEL LYMPH NODE BIOPSY Right 11/22/2021  ? Procedure: BREAST LUMPECTOMY WITH SENTINEL LYMPH NODE BX;  Surgeon: Robert Bellow, MD;  Location: [facility] ORS;  Service: General;  Laterality: Right;  ? CESAREAN SECTION    ? COLONOSCOPY  04/13/2020  ? NEUROPLASTY VOCAL CORD  2016  ? R Long Trigger Finger Release  03/24/2018  ? THYROID SURGERY  09/16/2013  ? THYROIDECTOMY TOTAL  ? ultrasound guided core breast biopsy  11/15/2021  ? ? ?FAMILY HISTORY: family history includes Arthritis in her mother; Diabetes in her father; Hyperlipidemia in her brother, father, and mother; Hypertension in her brother, father, and mother; Hypothyroidism in her paternal grandmother; Thyroid cancer in her paternal grandmother. ? ?SOCIAL HISTORY:  reports that she has never smoked. She has never used smokeless tobacco. She reports current alcohol use. She reports that she does not use drugs. ? ?ALLERGIES: Patient has no known allergies. ? ?MEDICATIONS:  ?Current Outpatient Medications  ?Medication Sig Dispense Refill  ? atorvastatin (LIPITOR) 80 MG tablet Take 80 mg by mouth daily.    ? escitalopram (LEXAPRO) 20 MG tablet Take 20 mg by mouth daily.    ? HYDROcodone-acetaminophen (NORCO/VICODIN) 5-325 MG tablet Take 1 tablet by mouth every 4 (four) hours as needed for moderate pain. 12 tablet 0  ? levothyroxine (SYNTHROID) 150 MCG tablet Take 150 mcg by mouth daily before breakfast.    ? losartan (  COZAAR) 100 MG tablet Take 100 mg by mouth daily.    ? metoprolol succinate (TOPROL-XL) 25 MG 24 hr tablet Take 25 mg by mouth daily.    ? cyclobenzaprine (FLEXERIL) 10 MG tablet Take 10 mg by mouth 3 (three) times daily as needed for muscle spasms. (Patient not taking: Reported on 12/04/2021)     ? ?No current facility-administered medications for this encounter.  ? ? ?ECOG PERFORMANCE STATUS:  0 - Asymptomatic ? ?REVIEW OF SYSTEMS: ?Patient denies any weight loss, fatigue, weakness, fever, chills or night sweats. Patient denies any loss of vision, blurred vision. Patient denies any ringing  of the ears or hearing loss. No irregular heartbeat. Patient denies heart murmur or history of fainting. Patient denies any chest pain or pain radiating to her upper extremities. Patient denies any shortness of breath, difficulty breathing at night, cough or hemoptysis. Patient denies any swelling in the lower legs. Patient denies any nausea vomiting, vomiting of blood, or coffee ground material in the vomitus. Patient denies any stomach pain. Patient states has had normal bowel movements no significant constipation or diarrhea. Patient denies any dysuria, hematuria or significant nocturia. Patient denies any problems walking, swelling in the joints or loss of balance. Patient denies any skin changes, loss of hair or loss of weight. Patient denies any excessive worrying or anxiety or significant depression. Patient denies any problems with insomnia. Patient denies excessive thirst, polyuria, polydipsia. Patient denies any swollen glands, patient denies easy bruising or easy bleeding. Patient denies any recent infections, allergies or URI. Patient "s visual fields have not changed significantly in recent time. ?  ?PHYSICAL EXAM: ?BP 125/79   Pulse 96   Temp 98.6 ?F (37 ?C)   Resp 18   Ht '5\' 5"'  (1.651 m)   Wt 179 lb 8 oz (81.4 kg)   BMI 29.87 kg/m?  ?Patient has wide local excision in the right breast which is healing well also right axillary incision which is also healing well.  No dominant masses noted in either breast.  No axillary or supraclavicular adenopathy is appreciated.  Well-developed well-nourished patient in NAD. HEENT reveals PERLA, EOMI, discs not visualized.  Oral cavity is clear. No oral mucosal  lesions are identified. Neck is clear without evidence of cervical or supraclavicular adenopathy. Lungs are clear to A&P. Cardiac examination is essentially unremarkable with regular rate and rhythm without murmur rub or thrill. Abdomen is benign with no organomegaly or masses noted. Motor sensory and DTR levels are equal and symmetric in the upper and lower extremities. Cranial nerves II through XII are grossly intact. Proprioception is intact. No peripheral adenopathy or edema is identified. No motor or sensory levels are noted. Crude visual fields are within normal range. ? ?LABORATORY DATA: Pathology report reviewed ? ?  ?RADIOLOGY RESULTS: Mammogram and ultrasound reviewed compatible with above-stated findings ? ? ?IMPRESSION: Stage Ia invasive mammary carcinoma of the right breast status post wide local excision and sentinel biopsy ER/PR positive in 56 year old female ? ?PLAN: At this time we will await her Oncotype DX to rule out possibility of systemic chemotherapy.  I have recommended a hypofractionated course of radiation therapy to her right breast over 3 weeks boosting her scar another 1000 cGy using electron beam.  Risks and benefits of treatment including skin reaction fatigue alteration of blood counts possible inclusion of superficial lung all were reviewed in detail with the patient.  She seems to comprehend my treatment plan well.  I have tentatively set up in about  a week and a half CT simulation will await Oncotype DX prior to proceeding with simulation.  Patient also will benefit from antiestrogen therapy after completion of radiation. ? ?I would like to take this opportunity to thank you for allowing me to participate in the care of your patient.. ? ? ? ?Noreene Filbert, MD ? ? ? ? ? ? ? ? ?

## 2021-12-05 ENCOUNTER — Ambulatory Visit: Payer: BC Managed Care – PPO | Admitting: Oncology

## 2021-12-06 ENCOUNTER — Encounter: Payer: Self-pay | Admitting: Oncology

## 2021-12-09 ENCOUNTER — Telehealth: Payer: Self-pay

## 2021-12-09 NOTE — Telephone Encounter (Signed)
Patient aware of response below.  ?

## 2021-12-09 NOTE — Telephone Encounter (Signed)
-----   Message from Lloyd Huger, MD sent at 12/09/2021 10:46 AM EDT ----- ?Please let patient know that her Oncotype score is low risk and chemotherapy is not needed.  Proceed with XRT as planned.  Please schedule follow-up appointment during her last week of XRT. ? ? ?----- Message ----- ?From: Secundino Ginger ?Sent: 12/06/2021   9:08 AM EDT ?To: Lloyd Huger, MD ? ? ?

## 2021-12-09 NOTE — Progress Notes (Signed)
Called patient to discuss oncotype result status and f/u recommendations. Pt has no questions at this time and verbalized understanding. Pt aware that we will be calling her to schedule follow up when we know more about the XRT schedule.

## 2021-12-16 ENCOUNTER — Ambulatory Visit
Admission: RE | Admit: 2021-12-16 | Discharge: 2021-12-16 | Disposition: A | Discharge status: Home / Self Care | Payer: BC Managed Care – PPO | Source: Ambulatory Visit | Attending: Radiation Oncology | Admitting: Radiation Oncology

## 2021-12-16 DIAGNOSIS — C50411 Malignant neoplasm of upper-outer quadrant of right female breast: Secondary | ICD-10-CM | POA: Diagnosis present

## 2021-12-16 DIAGNOSIS — Z170 Estrogen receptor positive status [ER+]: Secondary | ICD-10-CM | POA: Diagnosis not present

## 2021-12-16 DIAGNOSIS — Z510 Encounter for antineoplastic radiation therapy: Secondary | ICD-10-CM | POA: Diagnosis present

## 2021-12-18 DIAGNOSIS — C50411 Malignant neoplasm of upper-outer quadrant of right female breast: Secondary | ICD-10-CM | POA: Diagnosis not present

## 2021-12-23 ENCOUNTER — Ambulatory Visit: Admission: RE | Admit: 2021-12-23 | Payer: BC Managed Care – PPO | Source: Ambulatory Visit

## 2021-12-23 ENCOUNTER — Other Ambulatory Visit: Payer: Self-pay | Admitting: *Deleted

## 2021-12-23 DIAGNOSIS — C50411 Malignant neoplasm of upper-outer quadrant of right female breast: Secondary | ICD-10-CM | POA: Diagnosis not present

## 2021-12-24 ENCOUNTER — Other Ambulatory Visit: Payer: Self-pay

## 2021-12-24 ENCOUNTER — Ambulatory Visit
Admission: RE | Admit: 2021-12-24 | Discharge: 2021-12-24 | Disposition: A | Discharge status: Home / Self Care | Payer: BC Managed Care – PPO | Source: Ambulatory Visit | Attending: Radiation Oncology | Admitting: Radiation Oncology

## 2021-12-24 DIAGNOSIS — C50411 Malignant neoplasm of upper-outer quadrant of right female breast: Secondary | ICD-10-CM | POA: Diagnosis not present

## 2021-12-24 LAB — RAD ONC ARIA SESSION SUMMARY
Course Elapsed Days: 0
Plan Fractions Treated to Date: 1
Plan Prescribed Dose Per Fraction: 2.66 Gy
Plan Total Fractions Prescribed: 16
Plan Total Prescribed Dose: 42.56 Gy
Reference Point Dosage Given to Date: 2.66 Gy
Reference Point Session Dosage Given: 2.66 Gy
Session Number: 1

## 2021-12-25 ENCOUNTER — Other Ambulatory Visit: Payer: Self-pay

## 2021-12-25 ENCOUNTER — Ambulatory Visit
Admission: RE | Admit: 2021-12-25 | Discharge: 2021-12-25 | Disposition: A | Discharge status: Home / Self Care | Payer: BC Managed Care – PPO | Source: Ambulatory Visit | Attending: Radiation Oncology | Admitting: Radiation Oncology

## 2021-12-25 DIAGNOSIS — C50411 Malignant neoplasm of upper-outer quadrant of right female breast: Secondary | ICD-10-CM | POA: Diagnosis not present

## 2021-12-25 LAB — RAD ONC ARIA SESSION SUMMARY
Course Elapsed Days: 1
Plan Fractions Treated to Date: 2
Plan Prescribed Dose Per Fraction: 2.66 Gy
Plan Total Fractions Prescribed: 16
Plan Total Prescribed Dose: 42.56 Gy
Reference Point Dosage Given to Date: 5.32 Gy
Reference Point Session Dosage Given: 2.66 Gy
Session Number: 2

## 2021-12-25 NOTE — Telephone Encounter (Signed)
Error

## 2021-12-26 ENCOUNTER — Ambulatory Visit
Admission: RE | Admit: 2021-12-26 | Discharge: 2021-12-26 | Disposition: A | Discharge status: Home / Self Care | Payer: BC Managed Care – PPO | Source: Ambulatory Visit | Attending: Radiation Oncology | Admitting: Radiation Oncology

## 2021-12-26 ENCOUNTER — Other Ambulatory Visit: Payer: Self-pay

## 2021-12-26 DIAGNOSIS — C50411 Malignant neoplasm of upper-outer quadrant of right female breast: Secondary | ICD-10-CM | POA: Diagnosis not present

## 2021-12-26 LAB — RAD ONC ARIA SESSION SUMMARY
Course Elapsed Days: 2
Plan Fractions Treated to Date: 3
Plan Prescribed Dose Per Fraction: 2.66 Gy
Plan Total Fractions Prescribed: 16
Plan Total Prescribed Dose: 42.56 Gy
Reference Point Dosage Given to Date: 7.98 Gy
Reference Point Session Dosage Given: 2.66 Gy
Session Number: 3

## 2021-12-27 ENCOUNTER — Ambulatory Visit
Admission: RE | Admit: 2021-12-27 | Discharge: 2021-12-27 | Disposition: A | Discharge status: Home / Self Care | Payer: BC Managed Care – PPO | Source: Ambulatory Visit | Attending: Radiation Oncology | Admitting: Radiation Oncology

## 2021-12-27 ENCOUNTER — Other Ambulatory Visit: Payer: Self-pay

## 2021-12-27 DIAGNOSIS — C50411 Malignant neoplasm of upper-outer quadrant of right female breast: Secondary | ICD-10-CM | POA: Diagnosis not present

## 2021-12-27 LAB — RAD ONC ARIA SESSION SUMMARY
Course Elapsed Days: 3
Plan Fractions Treated to Date: 4
Plan Prescribed Dose Per Fraction: 2.66 Gy
Plan Total Fractions Prescribed: 16
Plan Total Prescribed Dose: 42.56 Gy
Reference Point Dosage Given to Date: 10.64 Gy
Reference Point Session Dosage Given: 2.66 Gy
Session Number: 4

## 2021-12-30 ENCOUNTER — Ambulatory Visit
Admission: RE | Admit: 2021-12-30 | Discharge: 2021-12-30 | Disposition: A | Discharge status: Home / Self Care | Payer: BC Managed Care – PPO | Source: Ambulatory Visit | Attending: Radiation Oncology | Admitting: Radiation Oncology

## 2021-12-30 ENCOUNTER — Other Ambulatory Visit: Payer: Self-pay

## 2021-12-30 DIAGNOSIS — C50411 Malignant neoplasm of upper-outer quadrant of right female breast: Secondary | ICD-10-CM | POA: Diagnosis not present

## 2021-12-30 DIAGNOSIS — Z510 Encounter for antineoplastic radiation therapy: Secondary | ICD-10-CM | POA: Insufficient documentation

## 2021-12-30 DIAGNOSIS — Z170 Estrogen receptor positive status [ER+]: Secondary | ICD-10-CM | POA: Diagnosis not present

## 2021-12-30 LAB — RAD ONC ARIA SESSION SUMMARY
Course Elapsed Days: 6
Plan Fractions Treated to Date: 5
Plan Prescribed Dose Per Fraction: 2.66 Gy
Plan Total Fractions Prescribed: 16
Plan Total Prescribed Dose: 42.56 Gy
Reference Point Dosage Given to Date: 13.3 Gy
Reference Point Session Dosage Given: 2.66 Gy
Session Number: 5

## 2021-12-31 ENCOUNTER — Other Ambulatory Visit: Payer: Self-pay

## 2021-12-31 ENCOUNTER — Ambulatory Visit
Admission: RE | Admit: 2021-12-31 | Discharge: 2021-12-31 | Disposition: A | Discharge status: Home / Self Care | Payer: BC Managed Care – PPO | Source: Ambulatory Visit | Attending: Radiation Oncology | Admitting: Radiation Oncology

## 2021-12-31 DIAGNOSIS — C50411 Malignant neoplasm of upper-outer quadrant of right female breast: Secondary | ICD-10-CM | POA: Diagnosis not present

## 2021-12-31 LAB — RAD ONC ARIA SESSION SUMMARY
Course Elapsed Days: 7
Plan Fractions Treated to Date: 6
Plan Prescribed Dose Per Fraction: 2.66 Gy
Plan Total Fractions Prescribed: 16
Plan Total Prescribed Dose: 42.56 Gy
Reference Point Dosage Given to Date: 15.96 Gy
Reference Point Session Dosage Given: 2.66 Gy
Session Number: 6

## 2022-01-01 ENCOUNTER — Ambulatory Visit
Admission: RE | Admit: 2022-01-01 | Discharge: 2022-01-01 | Disposition: A | Discharge status: Home / Self Care | Payer: BC Managed Care – PPO | Source: Ambulatory Visit | Attending: Radiation Oncology | Admitting: Radiation Oncology

## 2022-01-01 ENCOUNTER — Other Ambulatory Visit: Payer: Self-pay

## 2022-01-01 DIAGNOSIS — C50411 Malignant neoplasm of upper-outer quadrant of right female breast: Secondary | ICD-10-CM | POA: Diagnosis not present

## 2022-01-01 LAB — RAD ONC ARIA SESSION SUMMARY
Course Elapsed Days: 8
Plan Fractions Treated to Date: 7
Plan Prescribed Dose Per Fraction: 2.66 Gy
Plan Total Fractions Prescribed: 16
Plan Total Prescribed Dose: 42.56 Gy
Reference Point Dosage Given to Date: 18.62 Gy
Reference Point Session Dosage Given: 2.66 Gy
Session Number: 7

## 2022-01-02 ENCOUNTER — Ambulatory Visit
Admission: RE | Admit: 2022-01-02 | Discharge: 2022-01-02 | Disposition: A | Discharge status: Home / Self Care | Payer: BC Managed Care – PPO | Source: Ambulatory Visit | Attending: Radiation Oncology | Admitting: Radiation Oncology

## 2022-01-02 ENCOUNTER — Other Ambulatory Visit: Payer: Self-pay

## 2022-01-02 DIAGNOSIS — C50411 Malignant neoplasm of upper-outer quadrant of right female breast: Secondary | ICD-10-CM | POA: Diagnosis not present

## 2022-01-02 LAB — RAD ONC ARIA SESSION SUMMARY
Course Elapsed Days: 9
Plan Fractions Treated to Date: 8
Plan Prescribed Dose Per Fraction: 2.66 Gy
Plan Total Fractions Prescribed: 16
Plan Total Prescribed Dose: 42.56 Gy
Reference Point Dosage Given to Date: 21.28 Gy
Reference Point Session Dosage Given: 2.66 Gy
Session Number: 8

## 2022-01-03 ENCOUNTER — Ambulatory Visit
Admission: RE | Admit: 2022-01-03 | Discharge: 2022-01-03 | Disposition: A | Discharge status: Home / Self Care | Payer: BC Managed Care – PPO | Source: Ambulatory Visit | Attending: Radiation Oncology | Admitting: Radiation Oncology

## 2022-01-03 ENCOUNTER — Other Ambulatory Visit: Payer: Self-pay

## 2022-01-03 DIAGNOSIS — C50411 Malignant neoplasm of upper-outer quadrant of right female breast: Secondary | ICD-10-CM | POA: Diagnosis not present

## 2022-01-03 LAB — RAD ONC ARIA SESSION SUMMARY
Course Elapsed Days: 10
Plan Fractions Treated to Date: 9
Plan Prescribed Dose Per Fraction: 2.66 Gy
Plan Total Fractions Prescribed: 16
Plan Total Prescribed Dose: 42.56 Gy
Reference Point Dosage Given to Date: 23.94 Gy
Reference Point Session Dosage Given: 2.66 Gy
Session Number: 9

## 2022-01-06 ENCOUNTER — Ambulatory Visit
Admission: RE | Admit: 2022-01-06 | Discharge: 2022-01-06 | Disposition: A | Discharge status: Home / Self Care | Payer: BC Managed Care – PPO | Source: Ambulatory Visit | Attending: Radiation Oncology | Admitting: Radiation Oncology

## 2022-01-06 ENCOUNTER — Other Ambulatory Visit: Payer: Self-pay

## 2022-01-06 DIAGNOSIS — C50411 Malignant neoplasm of upper-outer quadrant of right female breast: Secondary | ICD-10-CM | POA: Diagnosis not present

## 2022-01-06 LAB — RAD ONC ARIA SESSION SUMMARY
Course Elapsed Days: 13
Plan Fractions Treated to Date: 10
Plan Prescribed Dose Per Fraction: 2.66 Gy
Plan Total Fractions Prescribed: 16
Plan Total Prescribed Dose: 42.56 Gy
Reference Point Dosage Given to Date: 26.6 Gy
Reference Point Session Dosage Given: 2.66 Gy
Session Number: 10

## 2022-01-07 ENCOUNTER — Other Ambulatory Visit: Payer: Self-pay

## 2022-01-07 ENCOUNTER — Ambulatory Visit
Admission: RE | Admit: 2022-01-07 | Discharge: 2022-01-07 | Disposition: A | Discharge status: Home / Self Care | Payer: BC Managed Care – PPO | Source: Ambulatory Visit | Attending: Radiation Oncology | Admitting: Radiation Oncology

## 2022-01-07 DIAGNOSIS — C50411 Malignant neoplasm of upper-outer quadrant of right female breast: Secondary | ICD-10-CM | POA: Diagnosis not present

## 2022-01-07 LAB — RAD ONC ARIA SESSION SUMMARY
Course Elapsed Days: 14
Plan Fractions Treated to Date: 11
Plan Prescribed Dose Per Fraction: 2.66 Gy
Plan Total Fractions Prescribed: 16
Plan Total Prescribed Dose: 42.56 Gy
Reference Point Dosage Given to Date: 29.26 Gy
Reference Point Session Dosage Given: 2.66 Gy
Session Number: 11

## 2022-01-08 ENCOUNTER — Ambulatory Visit
Admission: RE | Admit: 2022-01-08 | Discharge: 2022-01-08 | Disposition: A | Discharge status: Home / Self Care | Payer: BC Managed Care – PPO | Source: Ambulatory Visit | Attending: Radiation Oncology | Admitting: Radiation Oncology

## 2022-01-08 ENCOUNTER — Other Ambulatory Visit: Payer: Self-pay

## 2022-01-08 DIAGNOSIS — C50411 Malignant neoplasm of upper-outer quadrant of right female breast: Secondary | ICD-10-CM | POA: Diagnosis not present

## 2022-01-08 LAB — RAD ONC ARIA SESSION SUMMARY
Course Elapsed Days: 15
Plan Fractions Treated to Date: 12
Plan Prescribed Dose Per Fraction: 2.66 Gy
Plan Total Fractions Prescribed: 16
Plan Total Prescribed Dose: 42.56 Gy
Reference Point Dosage Given to Date: 31.92 Gy
Reference Point Session Dosage Given: 2.66 Gy
Session Number: 12

## 2022-01-09 ENCOUNTER — Other Ambulatory Visit: Payer: Self-pay

## 2022-01-09 ENCOUNTER — Ambulatory Visit
Admission: RE | Admit: 2022-01-09 | Discharge: 2022-01-09 | Disposition: A | Discharge status: Home / Self Care | Payer: BC Managed Care – PPO | Source: Ambulatory Visit | Attending: Radiation Oncology | Admitting: Radiation Oncology

## 2022-01-09 DIAGNOSIS — C50411 Malignant neoplasm of upper-outer quadrant of right female breast: Secondary | ICD-10-CM | POA: Diagnosis not present

## 2022-01-09 LAB — RAD ONC ARIA SESSION SUMMARY
Course Elapsed Days: 16
Plan Fractions Treated to Date: 13
Plan Prescribed Dose Per Fraction: 2.66 Gy
Plan Total Fractions Prescribed: 16
Plan Total Prescribed Dose: 42.56 Gy
Reference Point Dosage Given to Date: 34.58 Gy
Reference Point Session Dosage Given: 2.66 Gy
Session Number: 13

## 2022-01-10 ENCOUNTER — Inpatient Hospital Stay: Payer: BC Managed Care – PPO

## 2022-01-10 ENCOUNTER — Other Ambulatory Visit: Payer: Self-pay

## 2022-01-10 ENCOUNTER — Ambulatory Visit
Admission: RE | Admit: 2022-01-10 | Discharge: 2022-01-10 | Disposition: A | Discharge status: Home / Self Care | Payer: BC Managed Care – PPO | Source: Ambulatory Visit | Attending: Radiation Oncology | Admitting: Radiation Oncology

## 2022-01-10 DIAGNOSIS — Z170 Estrogen receptor positive status [ER+]: Secondary | ICD-10-CM | POA: Insufficient documentation

## 2022-01-10 DIAGNOSIS — C50411 Malignant neoplasm of upper-outer quadrant of right female breast: Secondary | ICD-10-CM | POA: Insufficient documentation

## 2022-01-10 LAB — CBC
HCT: 36.2 % (ref 36.0–46.0)
Hemoglobin: 11.9 g/dL — ABNORMAL LOW (ref 12.0–15.0)
MCH: 30 pg (ref 26.0–34.0)
MCHC: 32.9 g/dL (ref 30.0–36.0)
MCV: 91.2 fL (ref 80.0–100.0)
Platelets: 198 10*3/uL (ref 150–400)
RBC: 3.97 MIL/uL (ref 3.87–5.11)
RDW: 13.3 % (ref 11.5–15.5)
WBC: 4.7 10*3/uL (ref 4.0–10.5)
nRBC: 0 % (ref 0.0–0.2)

## 2022-01-10 LAB — RAD ONC ARIA SESSION SUMMARY
Course Elapsed Days: 17
Plan Fractions Treated to Date: 14
Plan Prescribed Dose Per Fraction: 2.66 Gy
Plan Total Fractions Prescribed: 16
Plan Total Prescribed Dose: 42.56 Gy
Reference Point Dosage Given to Date: 37.24 Gy
Reference Point Session Dosage Given: 2.66 Gy
Session Number: 14

## 2022-01-13 ENCOUNTER — Other Ambulatory Visit: Payer: Self-pay

## 2022-01-13 ENCOUNTER — Ambulatory Visit
Admission: RE | Admit: 2022-01-13 | Discharge: 2022-01-13 | Disposition: A | Discharge status: Home / Self Care | Payer: BC Managed Care – PPO | Source: Ambulatory Visit | Attending: Radiation Oncology | Admitting: Radiation Oncology

## 2022-01-13 DIAGNOSIS — C50411 Malignant neoplasm of upper-outer quadrant of right female breast: Secondary | ICD-10-CM | POA: Diagnosis not present

## 2022-01-13 LAB — RAD ONC ARIA SESSION SUMMARY
Course Elapsed Days: 20
Plan Fractions Treated to Date: 15
Plan Prescribed Dose Per Fraction: 2.66 Gy
Plan Total Fractions Prescribed: 16
Plan Total Prescribed Dose: 42.56 Gy
Reference Point Dosage Given to Date: 39.9 Gy
Reference Point Session Dosage Given: 2.66 Gy
Session Number: 15

## 2022-01-14 ENCOUNTER — Ambulatory Visit
Admission: RE | Admit: 2022-01-14 | Discharge: 2022-01-14 | Disposition: A | Discharge status: Home / Self Care | Payer: BC Managed Care – PPO | Source: Ambulatory Visit | Attending: Radiation Oncology | Admitting: Radiation Oncology

## 2022-01-14 ENCOUNTER — Other Ambulatory Visit: Payer: Self-pay

## 2022-01-14 DIAGNOSIS — C50411 Malignant neoplasm of upper-outer quadrant of right female breast: Secondary | ICD-10-CM | POA: Diagnosis not present

## 2022-01-14 LAB — RAD ONC ARIA SESSION SUMMARY
Course Elapsed Days: 21
Plan Fractions Treated to Date: 16
Plan Prescribed Dose Per Fraction: 2.66 Gy
Plan Total Fractions Prescribed: 16
Plan Total Prescribed Dose: 42.56 Gy
Reference Point Dosage Given to Date: 42.56 Gy
Reference Point Session Dosage Given: 2.66 Gy
Session Number: 16

## 2022-01-15 ENCOUNTER — Ambulatory Visit
Admission: RE | Admit: 2022-01-15 | Discharge: 2022-01-15 | Disposition: A | Discharge status: Home / Self Care | Payer: BC Managed Care – PPO | Source: Ambulatory Visit | Attending: Radiation Oncology | Admitting: Radiation Oncology

## 2022-01-15 ENCOUNTER — Other Ambulatory Visit: Payer: Self-pay

## 2022-01-15 DIAGNOSIS — C50411 Malignant neoplasm of upper-outer quadrant of right female breast: Secondary | ICD-10-CM | POA: Diagnosis not present

## 2022-01-15 LAB — RAD ONC ARIA SESSION SUMMARY
Course Elapsed Days: 22
Plan Fractions Treated to Date: 1
Plan Prescribed Dose Per Fraction: 2 Gy
Plan Total Fractions Prescribed: 5
Plan Total Prescribed Dose: 10 Gy
Reference Point Dosage Given to Date: 44.56 Gy
Reference Point Session Dosage Given: 2 Gy
Session Number: 17

## 2022-01-16 ENCOUNTER — Ambulatory Visit
Admission: RE | Admit: 2022-01-16 | Discharge: 2022-01-16 | Disposition: A | Discharge status: Home / Self Care | Payer: BC Managed Care – PPO | Source: Ambulatory Visit | Attending: Radiation Oncology | Admitting: Radiation Oncology

## 2022-01-16 ENCOUNTER — Other Ambulatory Visit: Payer: Self-pay

## 2022-01-16 DIAGNOSIS — C50411 Malignant neoplasm of upper-outer quadrant of right female breast: Secondary | ICD-10-CM | POA: Diagnosis not present

## 2022-01-16 LAB — RAD ONC ARIA SESSION SUMMARY
Course Elapsed Days: 23
Plan Fractions Treated to Date: 2
Plan Prescribed Dose Per Fraction: 2 Gy
Plan Total Fractions Prescribed: 5
Plan Total Prescribed Dose: 10 Gy
Reference Point Dosage Given to Date: 46.56 Gy
Reference Point Session Dosage Given: 2 Gy
Session Number: 18

## 2022-01-17 ENCOUNTER — Other Ambulatory Visit: Payer: Self-pay

## 2022-01-17 ENCOUNTER — Ambulatory Visit
Admission: RE | Admit: 2022-01-17 | Discharge: 2022-01-17 | Disposition: A | Discharge status: Home / Self Care | Payer: BC Managed Care – PPO | Source: Ambulatory Visit | Attending: Radiation Oncology | Admitting: Radiation Oncology

## 2022-01-17 DIAGNOSIS — C50411 Malignant neoplasm of upper-outer quadrant of right female breast: Secondary | ICD-10-CM | POA: Diagnosis not present

## 2022-01-17 LAB — RAD ONC ARIA SESSION SUMMARY
Course Elapsed Days: 24
Plan Fractions Treated to Date: 3
Plan Prescribed Dose Per Fraction: 2 Gy
Plan Total Fractions Prescribed: 5
Plan Total Prescribed Dose: 10 Gy
Reference Point Dosage Given to Date: 48.56 Gy
Reference Point Session Dosage Given: 2 Gy
Session Number: 19

## 2022-01-20 ENCOUNTER — Ambulatory Visit
Admission: RE | Admit: 2022-01-20 | Discharge: 2022-01-20 | Disposition: A | Discharge status: Home / Self Care | Payer: BC Managed Care – PPO | Source: Ambulatory Visit | Attending: Radiation Oncology | Admitting: Radiation Oncology

## 2022-01-20 ENCOUNTER — Other Ambulatory Visit: Payer: Self-pay

## 2022-01-20 DIAGNOSIS — C50411 Malignant neoplasm of upper-outer quadrant of right female breast: Secondary | ICD-10-CM | POA: Diagnosis not present

## 2022-01-20 LAB — RAD ONC ARIA SESSION SUMMARY
Course Elapsed Days: 27
Plan Fractions Treated to Date: 4
Plan Prescribed Dose Per Fraction: 2 Gy
Plan Total Fractions Prescribed: 5
Plan Total Prescribed Dose: 10 Gy
Reference Point Dosage Given to Date: 50.56 Gy
Reference Point Session Dosage Given: 2 Gy
Session Number: 20

## 2022-01-21 ENCOUNTER — Ambulatory Visit: Payer: BC Managed Care – PPO

## 2022-01-21 ENCOUNTER — Ambulatory Visit
Admission: RE | Admit: 2022-01-21 | Discharge: 2022-01-21 | Disposition: A | Discharge status: Home / Self Care | Payer: BC Managed Care – PPO | Source: Ambulatory Visit | Attending: Radiation Oncology | Admitting: Radiation Oncology

## 2022-01-21 ENCOUNTER — Other Ambulatory Visit: Payer: Self-pay

## 2022-01-21 DIAGNOSIS — C50411 Malignant neoplasm of upper-outer quadrant of right female breast: Secondary | ICD-10-CM | POA: Diagnosis not present

## 2022-01-21 LAB — RAD ONC ARIA SESSION SUMMARY
Course Elapsed Days: 28
Plan Fractions Treated to Date: 5
Plan Prescribed Dose Per Fraction: 2 Gy
Plan Total Fractions Prescribed: 5
Plan Total Prescribed Dose: 10 Gy
Reference Point Dosage Given to Date: 52.56 Gy
Reference Point Session Dosage Given: 2 Gy
Session Number: 21

## 2022-01-22 ENCOUNTER — Ambulatory Visit: Payer: BC Managed Care – PPO

## 2022-01-23 ENCOUNTER — Ambulatory Visit: Payer: BC Managed Care – PPO

## 2022-01-24 ENCOUNTER — Ambulatory Visit: Payer: BC Managed Care – PPO

## 2022-01-24 NOTE — Progress Notes (Unsigned)
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  INTERVAL HISTORY: Patient returns to clinic today for further evaluation, discussion of her final pathology results, and treatment planning.  She underwent lumpectomy on November 22, 2021 and tolerated the procedure well.  She currently feels well and is asymptomatic. She has no neurologic complaints.  She denies any recent fevers or illnesses.  She has a good appetite and denies weight loss.  She has no chest pain, shortness of breath, cough, or hemoptysis.  She denies any nausea, vomiting, constipation, or diarrhea.  She has no urinary complaints.  Patient offers no specific complaints today.  REVIEW OF SYSTEMS:   Review of Systems  Constitutional: Negative.  Negative for fever, malaise/fatigue and weight loss.  Respiratory: Negative.  Negative for cough, hemoptysis and shortness of breath.   Cardiovascular: Negative.  Negative for chest pain and leg swelling.  Gastrointestinal: Negative.  Negative for abdominal pain.  Genitourinary: Negative.  Negative for dysuria.  Musculoskeletal: Negative.  Negative for back pain.  Skin: Negative.  Negative for rash.  Neurological: Negative.  Negative for dizziness, focal weakness, weakness and headaches.  Psychiatric/Behavioral: Negative.  The patient is not nervous/anxious.    As per HPI. Otherwise, a complete review of systems is negative.  PAST MEDICAL HISTORY: Past  Medical History:  Diagnosis Date   ADHD    Breast cancer ([place])    Cancer ([place])    Thyroid   DDD (degenerative disc disease), lumbar    GERD (gastroesophageal reflux disease)    Hyperlipemia    Hypertension    Hypothyroidism    IBS (irritable bowel syndrome)    OSA (obstructive sleep apnea)    Papillary thyroid carcinoma ([facility])    Seasonal allergies     PAST SURGICAL HISTORY: Past Surgical History:  Procedure Laterality Date   BREAST LUMPECTOMY WITH SENTINEL LYMPH NODE BIOPSY Right 11/22/2021   Procedure: BREAST LUMPECTOMY WITH SENTINEL LYMPH NODE BX;  Surgeon: Robert Bellow, MD;  Location: [facility] ORS;  Service: General;  Laterality: Right;   CESAREAN SECTION     COLONOSCOPY  04/13/2020   NEUROPLASTY VOCAL CORD  2016   R Long Trigger Finger Release  03/24/2018   THYROID SURGERY  09/16/2013   THYROIDECTOMY TOTAL   ultrasound guided core breast biopsy  11/15/2021    FAMILY HISTORY: Family History  Problem Relation Age of Onset   Hypertension Mother    Arthritis Mother    Hyperlipidemia Mother    Hypertension Father    Hyperlipidemia Father    Diabetes Father    Hyperlipidemia Brother    Hypertension Brother    Thyroid cancer Paternal Grandmother    Hypothyroidism Paternal Grandmother    Breast cancer Neg Hx     ADVANCED DIRECTIVES (Y/N):  N  HEALTH MAINTENANCE: Social History   Tobacco Use   Smoking status: Never   Smokeless tobacco: Never  Vaping Use   Vaping Use: Never used  Substance Use Topics   Alcohol use: Yes    Comment: occasional on the weekend  Drug use: No     Colonoscopy:  PAP:  Bone density:  Lipid panel:  No Known Allergies   Current Outpatient Medications  Medication Sig Dispense Refill   atorvastatin (LIPITOR) 80 MG tablet Take 80 mg by mouth daily.     cyclobenzaprine (FLEXERIL) 10 MG tablet Take 10 mg by mouth 3 (three) times daily as needed for muscle spasms. (Patient not taking: Reported on 12/04/2021)     escitalopram  (LEXAPRO) 20 MG tablet Take 20 mg by mouth daily.     HYDROcodone-acetaminophen (NORCO/VICODIN) 5-325 MG tablet Take 1 tablet by mouth every 4 (four) hours as needed for moderate pain. 12 tablet 0   levothyroxine (SYNTHROID) 150 MCG tablet Take 150 mcg by mouth daily before breakfast.     losartan (COZAAR) 100 MG tablet Take 100 mg by mouth daily.     metoprolol succinate (TOPROL-XL) 25 MG 24 hr tablet Take 25 mg by mouth daily.     No current facility-administered medications for this visit.    OBJECTIVE: There were no vitals filed for this visit.    There is no height or weight on file to calculate BMI.    ECOG FS:0 - Asymptomatic  General: Well-developed, well-nourished, no acute distress. Eyes: Pink conjunctiva, anicteric sclera. HEENT: Normocephalic, moist mucous membranes. Lungs: No audible wheezing or coughing. Heart: Regular rate and rhythm. Abdomen: Soft, nontender, no obvious distention. Musculoskeletal: No edema, cyanosis, or clubbing. Neuro: Alert, answering all questions appropriately. Cranial nerves grossly intact. Skin: No rashes or petechiae noted. Psych: Normal affect.  LAB RESULTS:  Lab Results  Component Value Date   Castro 138 09/07/2013   K 3.6 09/07/2013   CL 103 09/07/2013   CO2 32 09/07/2013   GLUCOSE 109 (H) 09/07/2013   BUN 13 09/07/2013   CREATININE 0.80 09/17/2013   CALCIUM 8.5 09/17/2013   PROT 7.9 09/07/2013   ALBUMIN 3.8 09/17/2013   AST 47 (H) 09/07/2013   ALT 90 (H) 09/07/2013   ALKPHOS 106 09/07/2013   BILITOT 0.6 09/07/2013   GFRNONAA >60 09/17/2013   GFRAA >60 09/17/2013    Lab Results  Component Value Date   WBC 4.7 01/10/2022   NEUTROABS 5.5 09/07/2013   HGB 11.9 (L) 01/10/2022   HCT 36.2 01/10/2022   MCV 91.2 01/10/2022   PLT 198 01/10/2022     STUDIES: No results found.  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  2 lymph nodes removed did not reveal any evidence of metastasis.  Oncotype DX is pending at time of dictation.  Patient had consultation with radiation oncology today and will pursue adjuvant XRT unless Oncotype score returned high risk.  At the conclusion of her XRT patient will also benefit from an aromatase inhibitor for 5 years.  No further intervention is needed.  Return to clinic at the end of XRT for further evaluation and initiation of letrozole.  If patient's Oncotype score returns high risk, patient will follow-up sooner to discuss the results.    I spent a total of 30 minutes reviewing chart data, face-to-face evaluation with the patient, counseling and coordination of care as detailed above.   Patient expressed understanding and was in agreement with this plan. She also understands that She can call clinic at  any time with any questions, concerns, or complaints.    Cancer Staging  Carcinoma of upper-outer quadrant of right female breast [facility]) Staging form: Breast, AJCC 8th Edition - Clinical stage from 11/17/2021: Stage IA (cT1b, cN0, cM0, G1, ER+, PR+, HER2-) - Signed by Lloyd Huger, MD on 11/17/2021 Stage prefix: Initial diagnosis Histologic grading system: 3 grade system   Lloyd Huger, MD   01/24/2022 10:49 AM

## 2022-01-28 ENCOUNTER — Ambulatory Visit: Payer: BC Managed Care – PPO

## 2022-01-29 ENCOUNTER — Ambulatory Visit: Payer: BC Managed Care – PPO

## 2022-01-30 ENCOUNTER — Ambulatory Visit: Payer: BC Managed Care – PPO | Admitting: Oncology

## 2022-01-30 ENCOUNTER — Inpatient Hospital Stay: Payer: BC Managed Care – PPO | Attending: Oncology | Admitting: Oncology

## 2022-01-30 ENCOUNTER — Encounter: Payer: Self-pay | Admitting: Oncology

## 2022-01-30 DIAGNOSIS — Z85850 Personal history of malignant neoplasm of thyroid: Secondary | ICD-10-CM | POA: Diagnosis not present

## 2022-01-30 DIAGNOSIS — Z170 Estrogen receptor positive status [ER+]: Secondary | ICD-10-CM | POA: Insufficient documentation

## 2022-01-30 DIAGNOSIS — Z79899 Other long term (current) drug therapy: Secondary | ICD-10-CM | POA: Diagnosis not present

## 2022-01-30 DIAGNOSIS — Z923 Personal history of irradiation: Secondary | ICD-10-CM | POA: Insufficient documentation

## 2022-01-30 DIAGNOSIS — Z853 Personal history of malignant neoplasm of breast: Secondary | ICD-10-CM | POA: Insufficient documentation

## 2022-01-30 DIAGNOSIS — C50411 Malignant neoplasm of upper-outer quadrant of right female breast: Secondary | ICD-10-CM | POA: Diagnosis present

## 2022-01-30 DIAGNOSIS — Z79811 Long term (current) use of aromatase inhibitors: Secondary | ICD-10-CM | POA: Insufficient documentation

## 2022-01-30 MED ORDER — LETROZOLE 2.5 MG PO TABS: 2.5000 mg | ORAL_TABLET | Freq: Every day | ORAL | 3 refills | Status: DC

## 2022-01-30 NOTE — Progress Notes (Unsigned)
Pt states she has noticed multiple new moles underneath her left breast and was not sure if it is a radiation side effect. Also, pain from the radiation burn on left side.

## 2022-02-27 ENCOUNTER — Encounter: Payer: Self-pay | Admitting: Oncology

## 2022-02-28 ENCOUNTER — Ambulatory Visit
Admission: RE | Admit: 2022-02-28 | Discharge: 2022-02-28 | Disposition: A | Discharge status: Home / Self Care | Payer: BC Managed Care – PPO | Source: Ambulatory Visit | Attending: Radiation Oncology | Admitting: Radiation Oncology

## 2022-02-28 VITALS — BP 114/72 | HR 79 | Resp 18 | Ht 65.0 in | Wt 180.7 lb

## 2022-02-28 DIAGNOSIS — Z923 Personal history of irradiation: Secondary | ICD-10-CM | POA: Insufficient documentation

## 2022-02-28 DIAGNOSIS — Z170 Estrogen receptor positive status [ER+]: Secondary | ICD-10-CM | POA: Insufficient documentation

## 2022-02-28 DIAGNOSIS — C50411 Malignant neoplasm of upper-outer quadrant of right female breast: Secondary | ICD-10-CM | POA: Insufficient documentation

## 2022-02-28 NOTE — Progress Notes (Signed)
Radiation Oncology Follow up Note  Name: Lori Castro   Date:   02/28/2022 MRN:  [id number] DOB: [birth year]    This 56 y.o. female presents to the clinic today for 1 month follow-up status post whole breast radiation to her right breast for stage Ia (T1 cN0 M0) ER/PR positive invasive mammary carcinoma.  REFERRING PROVIDER: Adin Hector, MD  HPI: Patient is a 56 year old female now at 1 month having completed whole breast radiation to her right breast for stage Ia ER/PR positive invasive mammary carcinoma.  Seen today in routine follow-up she is doing well specifically denies breast tenderness cough or bone pain she has been started on letrozole and she believes that might be causing insomnia..  COMPLICATIONS OF TREATMENT: none  FOLLOW UP COMPLIANCE: keeps appointments   PHYSICAL EXAM:  BP 114/72   Pulse 79   Resp 18   Ht '5\' 5"'$  (1.651 m)   Wt 180 lb 11.2 oz (82 kg)   BMI 30.07 kg/m  Lungs are clear to A&P cardiac examination essentially unremarkable with regular rate and rhythm. No dominant mass or nodularity is noted in either breast in 2 positions examined. Incision is well-healed. No axillary or supraclavicular adenopathy is appreciated. Cosmetic result is excellent.  Well-developed well-nourished patient in NAD. HEENT reveals PERLA, EOMI, discs not visualized.  Oral cavity is clear. No oral mucosal lesions are identified. Neck is clear without evidence of cervical or supraclavicular adenopathy. Lungs are clear to A&P. Cardiac examination is essentially unremarkable with regular rate and rhythm without murmur rub or thrill. Abdomen is benign with no organomegaly or masses noted. Motor sensory and DTR levels are equal and symmetric in the upper and lower extremities. Cranial nerves II through XII are grossly intact. Proprioception is intact. No peripheral adenopathy or edema is identified. No motor or sensory levels are noted. Crude visual fields are within normal range.  RADIOLOGY  RESULTS: No current films for review  PLAN: Present time she is doing well low side effect profile from whole breast radiation.  And pleased with her overall progress I have suggested she stopped letrozole for couple weeks to see if that affects her sleep.  If that does she will report back to medical oncology.  Otherwise have asked to see her back in 5 months for follow-up.  Patient is to call with any concerns.  I would like to take this opportunity to thank you for allowing me to participate in the care of your patient.Noreene Filbert, MD

## 2022-03-21 ENCOUNTER — Telehealth: Payer: Self-pay | Admitting: *Deleted

## 2022-03-21 NOTE — Telephone Encounter (Signed)
Call placed to patient to follow up on side effects related reported while on letrozole. Patient was reporting difficulty sleeping, stopped letrozole for 2 weeks to see if this improved. Patient reports sleep did improve for about 1 week after stopping letrozole, she is unsure if sleep difficulty is related to medication. She is interested in trying alternative to letrozole if possible. Please advise.

## 2022-03-24 ENCOUNTER — Other Ambulatory Visit: Payer: Self-pay | Admitting: *Deleted

## 2022-03-24 MED ORDER — ANASTROZOLE 1 MG PO TABS
1.0000 mg | ORAL_TABLET | Freq: Every day | ORAL | 3 refills | Status: DC
Start: 2022-03-24 — End: 2022-07-28

## 2022-03-26 ENCOUNTER — Other Ambulatory Visit: Payer: BC Managed Care – PPO

## 2022-04-16 ENCOUNTER — Ambulatory Visit
Admission: RE | Admit: 2022-04-16 | Discharge: 2022-04-16 | Disposition: A | Discharge status: Home / Self Care | Payer: BC Managed Care – PPO | Source: Ambulatory Visit | Attending: Oncology | Admitting: Oncology

## 2022-04-16 DIAGNOSIS — Z79811 Long term (current) use of aromatase inhibitors: Secondary | ICD-10-CM | POA: Diagnosis present

## 2022-04-16 DIAGNOSIS — C50411 Malignant neoplasm of upper-outer quadrant of right female breast: Secondary | ICD-10-CM | POA: Insufficient documentation

## 2022-05-06 ENCOUNTER — Ambulatory Visit: Payer: BC Managed Care – PPO | Admitting: Oncology

## 2022-05-20 ENCOUNTER — Ambulatory Visit: Payer: BC Managed Care – PPO | Admitting: Oncology

## 2022-05-22 ENCOUNTER — Ambulatory Visit: Payer: BC Managed Care – PPO | Admitting: Oncology

## 2022-05-27 ENCOUNTER — Encounter: Payer: Self-pay | Admitting: Oncology

## 2022-05-27 ENCOUNTER — Other Ambulatory Visit: Payer: Self-pay

## 2022-05-27 DIAGNOSIS — C50411 Malignant neoplasm of upper-outer quadrant of right female breast: Secondary | ICD-10-CM

## 2022-05-28 NOTE — Addendum Note (Signed)
Addended by: Delice Bison E on: 05/28/2022 03:29 PM   Modules accepted: Orders

## 2022-06-02 NOTE — Progress Notes (Signed)
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  Low risk Oncotype score 14.  INTERVAL HISTORY: Patient returns to clinic today for routine 52-monthevaluation and assess her toleration of anastrozole.  She has some mild flank tenderness near the site of her surgery, but otherwise feels well.  She is tolerating and asked resolved well without significant side effects. She has no neurologic complaints.  She denies any recent fevers or illnesses.  She has a good appetite and denies weight loss.  She has no chest pain, shortness of breath, cough, or hemoptysis.  She denies any nausea, vomiting, constipation, or diarrhea.  She has no urinary complaints.  Patient offers no further specific complaints today.  REVIEW OF SYSTEMS:   Review of Systems  Constitutional: Negative.  Negative for fever, malaise/fatigue and weight loss.  Respiratory: Negative.  Negative for cough, hemoptysis and shortness of breath.   Cardiovascular: Negative.  Negative for chest pain and leg swelling.  Gastrointestinal: Negative.  Negative for abdominal pain.  Genitourinary:  Positive for flank pain. Negative for dysuria.  Musculoskeletal:  Negative for back pain.  Skin: Negative.  Negative for rash.  Neurological: Negative.  Negative for dizziness, focal weakness, weakness and headaches.  Psychiatric/Behavioral:  Negative.  The patient is not nervous/anxious.     As per HPI. Otherwise, a complete review of systems is negative.  PAST MEDICAL HISTORY: Past Medical History:  Diagnosis Date   ADHD    [place]    [place]    Thyroid   DDD (degenerative disc disease), lumbar    GERD (gastroesophageal reflux disease)    Hyperlipemia    Hypertension    Hypothyroidism    IBS (irritable bowel syndrome)    OSA (obstructive sleep apnea)    Papillary thyroid carcinoma ([facility])    Seasonal allergies     PAST SURGICAL HISTORY: Past Surgical History:  Procedure Laterality Date   BREAST LUMPECTOMY WITH SENTINEL LYMPH NODE BIOPSY Right 11/22/2021   Procedure: BREAST LUMPECTOMY WITH SENTINEL LYMPH NODE BX;  Surgeon: BRobert Bellow MD;  Location: [facility] ORS;  Service: General;  Laterality: Right;   CESAREAN SECTION     COLONOSCOPY  04/13/2020   NEUROPLASTY VOCAL CORD  2016   R Long Trigger Finger Release  03/24/2018   THYROID SURGERY  09/16/2013   THYROIDECTOMY TOTAL   ultrasound guided core breast biopsy  11/15/2021    FAMILY HISTORY: Family History  Problem Relation Age of Onset   Hypertension Mother    Arthritis Mother    Hyperlipidemia Mother    Hypertension Father    Hyperlipidemia Father    Diabetes Father    Hyperlipidemia Brother    Hypertension Brother    Thyroid cancer Paternal Grandmother    Hypothyroidism Paternal Grandmother    Breast cancer Neg Hx     ADVANCED DIRECTIVES (Y/N):  N  HEALTH MAINTENANCE: Social History   Tobacco Use   Smoking status: Never   Smokeless tobacco: Never  Vaping Use   Vaping Use: Never used  Substance Use Topics   Alcohol use: Yes    Comment: occasional on the weekend   Drug use: No     Colonoscopy:  PAP:  Bone density:  Lipid panel:  No Known Allergies   Current Outpatient Medications  Medication Sig Dispense Refill   anastrozole (ARIMIDEX) 1 MG tablet Take 1 tablet (1 mg total) by mouth daily. 30 tablet 3    atorvastatin (LIPITOR) 80 MG tablet Take 80 mg by mouth daily.     escitalopram (LEXAPRO) 20 MG tablet Take 20 mg by mouth daily.     levothyroxine (SYNTHROID) 150 MCG tablet Take 150 mcg by mouth daily before breakfast.     losartan (COZAAR) 100 MG tablet Take 100 mg by mouth daily.     metoprolol succinate (TOPROL-XL) 25 MG 24 hr tablet Take 25 mg by mouth daily.     cyclobenzaprine (FLEXERIL) 10 MG tablet Take 10 mg by mouth 3 (three) times daily as needed for muscle spasms. (Patient not taking: Reported on 12/04/2021)     No current facility-administered medications for this visit.    OBJECTIVE: Vitals:   06/03/22 1525  BP: (!) 146/87  Pulse: 96  Temp: 98.9 F (37.2 C)      Body mass index is 29.12 kg/m.    ECOG FS:0 - Asymptomatic  General: Well-developed, well-nourished, no acute distress. Eyes: Pink conjunctiva, anicteric sclera. HEENT: Normocephalic, moist mucous membranes. Breast: Right breast with well-healing surgical scar.  No erythema or masses noted. Lungs: No audible wheezing or coughing. Heart: Regular rate and rhythm. Abdomen: Soft, nontender, no obvious distention. Musculoskeletal: No edema, cyanosis, or clubbing. Neuro: Alert, answering all questions appropriately. Cranial nerves grossly intact. Skin: No rashes or petechiae noted. Psych: Normal affect.    LAB RESULTS:  Lab Results  Component Value Date   Castro 138 09/07/2013   K 3.6 09/07/2013   CL 103 09/07/2013   CO2 32 09/07/2013   GLUCOSE 109 (H) 09/07/2013   BUN 13 09/07/2013   CREATININE 0.80 09/17/2013   CALCIUM 8.5 09/17/2013   PROT 7.9 09/07/2013   ALBUMIN 3.8 09/17/2013   AST 47 (H) 09/07/2013   ALT 90 (H) 09/07/2013   ALKPHOS 106 09/07/2013   BILITOT 0.6 09/07/2013   GFRNONAA >60 09/17/2013   GFRAA >60 09/17/2013    Lab Results  Component Value Date   WBC 4.7 01/10/2022   NEUTROABS 5.5 09/07/2013   HGB 11.9 (L) 01/10/2022   HCT 36.2 01/10/2022   MCV 91.2 01/10/2022   PLT 198  01/10/2022     STUDIES: No results found.  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  Low risk Oncotype score 14.  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  2 lymph nodes removed did not reveal any evidence of metastasis.  Oncotype DX was low risk, therefore she did not require adjuvant chemotherapy.  She completed adjuvant XRT in May 2023.  Continue anastrozole for total of 5 years completing treatment in June 2028.  Patient had a video assisted telemedicine visit in 6 months for routine evaluation.  Bone health: Baseline bone mineral density on April 16, 2022 revealed a T score of 0.3 which is considered normal.  Continue vitamin D and calcium supplementation.  Repeat in August 2025.  Flank pain: Appears musculoskeletal in nature.  Recommended OTC anti-inflammatory.  Monitor.     Patient expressed understanding and was in agreement with this plan. She also understands that She can call clinic at any time with any questions, concerns, or complaints.    Cancer Staging  Carcinoma of upper-outer quadrant of right female breast [facility]) Staging form: Breast, AJCC 8th Edition - Clinical stage from 11/17/2021: Stage IA (cT1b, cN0, cM0, G1, ER+, PR+, HER2-) - Signed by Lloyd Huger, MD on 11/17/2021 Stage prefix: Initial diagnosis Histologic grading system: 3 grade system   Lloyd Huger, MD   06/03/2022 4:01 PM

## 2022-06-03 ENCOUNTER — Encounter: Payer: Self-pay | Admitting: Oncology

## 2022-06-03 ENCOUNTER — Inpatient Hospital Stay: Payer: BC Managed Care – PPO | Attending: Oncology | Admitting: Oncology

## 2022-06-03 VITALS — BP 146/87 | HR 96 | Temp 98.9°F | Ht 65.0 in | Wt 175.0 lb

## 2022-06-03 DIAGNOSIS — R109 Unspecified abdominal pain: Secondary | ICD-10-CM | POA: Insufficient documentation

## 2022-06-03 DIAGNOSIS — Z923 Personal history of irradiation: Secondary | ICD-10-CM | POA: Insufficient documentation

## 2022-06-03 DIAGNOSIS — Z85850 Personal history of malignant neoplasm of thyroid: Secondary | ICD-10-CM | POA: Insufficient documentation

## 2022-06-03 DIAGNOSIS — Z170 Estrogen receptor positive status [ER+]: Secondary | ICD-10-CM | POA: Diagnosis not present

## 2022-06-03 DIAGNOSIS — Z79811 Long term (current) use of aromatase inhibitors: Secondary | ICD-10-CM | POA: Diagnosis not present

## 2022-06-03 DIAGNOSIS — C50411 Malignant neoplasm of upper-outer quadrant of right female breast: Secondary | ICD-10-CM | POA: Diagnosis present

## 2022-06-03 DIAGNOSIS — Z79899 Other long term (current) drug therapy: Secondary | ICD-10-CM | POA: Insufficient documentation

## 2022-06-03 NOTE — Progress Notes (Signed)
Survivorship Care Plan visit completed.  Treatment summary reviewed and given to patient.  ASCO answers booklet reviewed and given to patient.  CARE program and Cancer Transitions discussed with patient along with other resources cancer center offers to patients and caregivers.  Patient verbalized understanding.    

## 2022-06-09 ENCOUNTER — Ambulatory Visit (INDEPENDENT_AMBULATORY_CARE_PROVIDER_SITE_OTHER): Payer: BC Managed Care – PPO | Admitting: Dermatology

## 2022-06-09 DIAGNOSIS — L820 Inflamed seborrheic keratosis: Secondary | ICD-10-CM | POA: Diagnosis not present

## 2022-06-09 DIAGNOSIS — L821 Other seborrheic keratosis: Secondary | ICD-10-CM | POA: Diagnosis not present

## 2022-06-09 DIAGNOSIS — L578 Other skin changes due to chronic exposure to nonionizing radiation: Secondary | ICD-10-CM

## 2022-06-09 NOTE — Patient Instructions (Addendum)
Seborrheic Keratosis  What causes seborrheic keratoses? Seborrheic keratoses are harmless, common skin growths that first appear during adult life.  As time goes by, more growths appear.  Some people may develop a large number of them.  Seborrheic keratoses appear on both covered and uncovered body parts.  They are not caused by sunlight.  The tendency to develop seborrheic keratoses can be inherited.  They vary in color from skin-colored to gray, brown, or even black.  They can be either smooth or have a rough, warty surface.   Seborrheic keratoses are superficial and look as if they were stuck on the skin.  Under the microscope this type of keratosis looks like layers upon layers of skin.  That is why at times the top layer may seem to fall off, but the rest of the growth remains and re-grows.    Treatment Seborrheic keratoses do not need to be treated, but can easily be removed in the office.  Seborrheic keratoses often cause symptoms when they rub on clothing or jewelry.  Lesions can be in the way of shaving.  If they become inflamed, they can cause itching, soreness, or burning.  Removal of a seborrheic keratosis can be accomplished by freezing, burning, or surgery. If any spot bleeds, scabs, or grows rapidly, please return to have it checked, as these can be an indication of a skin cancer.  Cryotherapy Aftercare  Wash gently with soap and water everyday.   Apply Vaseline and Band-Aid daily until healed.    Due to recent changes in healthcare laws, you may see results of your pathology and/or laboratory studies on MyChart before the doctors have had a chance to review them. We understand that in some cases there may be results that are confusing or concerning to you. Please understand that not all results are received at the same time and often the doctors may need to interpret multiple results in order to provide you with the best plan of care or course of treatment. Therefore, we ask that you  please give us 2 business days to thoroughly review all your results before contacting the office for clarification. Should we see a critical lab result, you will be contacted sooner.   If You Need Anything After Your Visit  If you have any questions or concerns for your doctor, please call our main line at [phone] and press option 4 to reach your doctor's medical assistant. If no one answers, please leave a voicemail as directed and we will return your call as soon as possible. Messages left after 4 pm will be answered the following business day.   You may also send us a message via MyChart. We typically respond to MyChart messages within 1-2 business days.  For prescription refills, please ask your pharmacy to contact our office. Our fax number is [phone].  If you have an urgent issue when the clinic is closed that cannot wait until the next business day, you can page your doctor at the number below.    Please note that while we do our best to be available for urgent issues outside of office hours, we are not available 24/7.   If you have an urgent issue and are unable to reach us, you may choose to seek medical care at your doctor's office, retail clinic, urgent care center, or emergency room.  If you have a medical emergency, please immediately call 911 or go to the emergency department.  Pager Numbers  - Dr. Kowalski: [phone]  -   Dr. Moye: [phone]  - Dr. Stewart: [phone]  In the event of inclement weather, please call our main line at [phone] for an update on the status of any delays or closures.  Dermatology Medication Tips: Please keep the boxes that topical medications come in in order to help keep track of the instructions about where and how to use these. Pharmacies typically print the medication instructions only on the boxes and not directly on the medication tubes.   If your medication is too expensive, please contact our office at  [phone] option 4 or send us a message through MyChart.   We are unable to tell what your co-pay for medications will be in advance as this is different depending on your insurance coverage. However, we may be able to find a substitute medication at lower cost or fill out paperwork to get insurance to cover a needed medication.   If a prior authorization is required to get your medication covered by your insurance company, please allow us 1-2 business days to complete this process.  Drug prices often vary depending on where the prescription is filled and some pharmacies may offer cheaper prices.  The website www.goodrx.com contains coupons for medications through different pharmacies. The prices here do not account for what the cost may be with help from insurance (it may be cheaper with your insurance), but the website can give you the price if you did not use any insurance.  - You can print the associated coupon and take it with your prescription to the pharmacy.  - You may also stop by our office during regular business hours and pick up a GoodRx coupon card.  - If you need your prescription sent electronically to a different pharmacy, notify our office through [facility] MyChart or by phone at [phone] option 4.     Si Usted Necesita Algo Despus de Su Visita  Tambin puede enviarnos un mensaje a travs de MyChart. Por lo general respondemos a los mensajes de MyChart en el transcurso de 1 a 2 das hbiles.  Para renovar recetas, por favor pida a su farmacia que se ponga en contacto con nuestra oficina. Nuestro nmero de fax es el [phone].  Si tiene un asunto urgente cuando la clnica est cerrada y que no puede esperar hasta el siguiente da hbil, puede llamar/localizar a su doctor(a) al nmero que aparece a continuacin.   Por favor, tenga en cuenta que aunque hacemos todo lo posible para estar disponibles para asuntos urgentes fuera del horario de oficina, no estamos  disponibles las 24 horas del da, los 7 das de la semana.   Si tiene un problema urgente y no puede comunicarse con nosotros, puede optar por buscar atencin mdica  en el consultorio de su doctor(a), en una clnica privada, en un centro de atencin urgente o en una sala de emergencias.  Si tiene una emergencia mdica, por favor llame inmediatamente al 911 o vaya a la sala de emergencias.  Nmeros de bper  - Dr. Kowalski: [phone]  - Dra. Moye: [phone]  - Dra. Stewart: [phone]  En caso de inclemencias del tiempo, por favor llame a nuestra lnea principal al [phone] para una actualizacin sobre el estado de cualquier retraso o cierre.  Consejos para la medicacin en dermatologa: Por favor, guarde las cajas en las que vienen los medicamentos de uso tpico para ayudarle a seguir las instrucciones sobre dnde y cmo usarlos. Las farmacias generalmente imprimen las instrucciones del medicamento slo en las cajas y   no directamente en los tubos del medicamento.   Si su medicamento es muy caro, por favor, pngase en contacto con nuestra oficina llamando al [phone] y presione la opcin 4 o envenos un mensaje a travs de MyChart.   No podemos decirle cul ser su copago por los medicamentos por adelantado ya que esto es diferente dependiendo de la cobertura de su seguro. Sin embargo, es posible que podamos encontrar un medicamento sustituto a menor costo o llenar un formulario para que el seguro cubra el medicamento que se considera necesario.   Si se requiere una autorizacin previa para que su compaa de seguros cubra su medicamento, por favor permtanos de 1 a 2 das hbiles para completar este proceso.  Los precios de los medicamentos varan con frecuencia dependiendo del lugar de dnde se surte la receta y alguna farmacias pueden ofrecer precios ms baratos.  El sitio web www.goodrx.com tiene cupones para medicamentos de diferentes farmacias. Los precios aqu no  tienen en cuenta lo que podra costar con la ayuda del seguro (puede ser ms barato con su seguro), pero el sitio web puede darle el precio si no utiliz ningn seguro.  - Puede imprimir el cupn correspondiente y llevarlo con su receta a la farmacia.  - Tambin puede pasar por nuestra oficina durante el horario de atencin regular y recoger una tarjeta de cupones de GoodRx.  - Si necesita que su receta se enve electrnicamente a una farmacia diferente, informe a nuestra oficina a travs de MyChart de  o por telfono llamando al [phone] y presione la opcin 4.  

## 2022-06-09 NOTE — Progress Notes (Signed)
   Follow-Up Visit   Subjective  Lori Castro is a 56 y.o. female who presents for the following: Follow-up (Patient would like moles under right breast checked and spot at left breast checked. Patient reports history of breast cancer in the last year in right breast. Patient has undergone radiation ). The patient has spots, moles and lesions to be evaluated, some may be new or changing and the patient has concerns that these could be cancer.  The following portions of the chart were reviewed this encounter and updated as appropriate:  Tobacco  Allergies  Meds  Problems  Med Hx  Surg Hx  Fam Hx     Review of Systems: No other skin or systemic complaints except as noted in HPI or Assessment and Plan.  Objective  Well appearing patient in no apparent distress; mood and affect are within normal limits.  A focused examination was performed including face, neck, chest and back. Relevant physical exam findings are noted in the Assessment and Plan.  left breast x 1, inframammary and abdomen x 16 (17) Erythematous stuck-on, waxy papule or plaque   Assessment & Plan  Inflamed seborrheic keratosis (17) left breast x 1, inframammary and abdomen x 16  Symptomatic, irritating, patient would like treated.  Destruction of lesion - left breast x 1, inframammary and abdomen x 16 Complexity: simple   Destruction method: cryotherapy   Informed consent: discussed and consent obtained   Timeout:  patient name, date of birth, surgical site, and procedure verified Lesion destroyed using liquid nitrogen: Yes   Region frozen until ice ball extended beyond lesion: Yes   Outcome: patient tolerated procedure well with no complications   Post-procedure details: wound care instructions given   Additional details:  Prior to procedure, discussed risks of blister formation, small wound, skin dyspigmentation, or rare scar following cryotherapy. Recommend Vaseline ointment to treated areas while  healing.   Seborrheic Keratoses - Stuck-on, waxy, tan-brown papules and/or plaques  - Benign-appearing - Discussed benign etiology and prognosis. - Observe - Call for any changes  Actinic Damage - chronic, secondary to cumulative UV radiation exposure/sun exposure over time - diffuse scaly erythematous macules with underlying dyspigmentation - Recommend daily broad spectrum sunscreen SPF 30+ to sun-exposed areas, reapply every 2 hours as needed.  - Recommend staying in the shade or wearing long sleeves, sun glasses (UVA+UVB protection) and wide brim hats (4-inch brim around the entire circumference of the hat). - Call for new or changing lesions.  Return if symptoms worsen or fail to improve. IRuthell Rummage, CMA, am acting as scribe for Sarina Ser, MD. Documentation: I have reviewed the above documentation for accuracy and completeness, and I agree with the above.  Sarina Ser, MD

## 2022-06-16 ENCOUNTER — Encounter: Payer: Self-pay | Admitting: Dermatology

## 2022-07-27 ENCOUNTER — Other Ambulatory Visit: Payer: Self-pay | Admitting: Oncology

## 2022-08-04 ENCOUNTER — Ambulatory Visit: Payer: BC Managed Care – PPO | Admitting: Radiation Oncology

## 2022-08-04 ENCOUNTER — Telehealth: Payer: Self-pay | Admitting: Radiation Oncology

## 2022-08-04 NOTE — Telephone Encounter (Signed)
Patient called and got our answering service on 08/03/22 @ 7:21 pm. She wants to cancel her appt with DR Chrystal today @ 230 pm.

## 2022-09-05 ENCOUNTER — Other Ambulatory Visit: Payer: Self-pay | Admitting: General Surgery

## 2022-09-05 ENCOUNTER — Encounter: Payer: Self-pay | Admitting: General Surgery

## 2022-09-05 DIAGNOSIS — Z853 Personal history of malignant neoplasm of breast: Secondary | ICD-10-CM

## 2022-10-09 ENCOUNTER — Ambulatory Visit
Admission: RE | Admit: 2022-10-09 | Discharge: 2022-10-09 | Disposition: A | Discharge status: Home / Self Care | Payer: BC Managed Care – PPO | Source: Ambulatory Visit | Attending: General Surgery | Admitting: General Surgery

## 2022-10-09 DIAGNOSIS — Z853 Personal history of malignant neoplasm of breast: Secondary | ICD-10-CM

## 2022-10-10 ENCOUNTER — Other Ambulatory Visit: Payer: Self-pay | Admitting: General Surgery

## 2022-10-10 DIAGNOSIS — R928 Other abnormal and inconclusive findings on diagnostic imaging of breast: Secondary | ICD-10-CM

## 2022-10-15 ENCOUNTER — Ambulatory Visit
Admission: RE | Admit: 2022-10-15 | Discharge: 2022-10-15 | Disposition: A | Discharge status: Home / Self Care | Payer: BC Managed Care – PPO | Source: Ambulatory Visit | Attending: General Surgery | Admitting: General Surgery

## 2022-10-15 DIAGNOSIS — R928 Other abnormal and inconclusive findings on diagnostic imaging of breast: Secondary | ICD-10-CM | POA: Insufficient documentation

## 2022-10-15 HISTORY — PX: BREAST BIOPSY: SHX20

## 2022-10-15 MED ORDER — LIDOCAINE HCL (PF) 1 % IJ SOLN
15.0000 mL | Freq: Once | INTRAMUSCULAR | Status: AC
  Administered 2022-10-15: 15 mL

## 2022-10-15 MED ORDER — LIDOCAINE-EPINEPHRINE 1 %-1:100000 IJ SOLN
10.0000 mL | Freq: Once | INTRAMUSCULAR | Status: AC
  Administered 2022-10-15: 10 mL

## 2022-10-16 LAB — SURGICAL PATHOLOGY

## 2022-12-04 ENCOUNTER — Inpatient Hospital Stay: Payer: BC Managed Care – PPO | Admitting: Oncology

## 2022-12-05 ENCOUNTER — Other Ambulatory Visit: Payer: Self-pay | Admitting: Oncology

## 2023-01-02 ENCOUNTER — Other Ambulatory Visit: Payer: Self-pay | Admitting: Oncology

## 2023-02-13 ENCOUNTER — Other Ambulatory Visit: Payer: Self-pay | Admitting: General Surgery

## 2023-02-13 DIAGNOSIS — Z853 Personal history of malignant neoplasm of breast: Secondary | ICD-10-CM

## 2023-03-13 ENCOUNTER — Other Ambulatory Visit: Payer: Self-pay | Admitting: Oncology

## 2023-04-16 ENCOUNTER — Other Ambulatory Visit: Payer: BC Managed Care – PPO

## 2023-04-22 ENCOUNTER — Ambulatory Visit
Admission: RE | Admit: 2023-04-22 | Discharge: 2023-04-22 | Disposition: A | Discharge status: Home / Self Care | Payer: BC Managed Care – PPO | Source: Ambulatory Visit | Attending: General Surgery | Admitting: General Surgery

## 2023-04-22 DIAGNOSIS — Z853 Personal history of malignant neoplasm of breast: Secondary | ICD-10-CM | POA: Diagnosis present

## 2023-06-12 ENCOUNTER — Other Ambulatory Visit: Payer: Self-pay | Admitting: Oncology

## 2023-07-14 ENCOUNTER — Other Ambulatory Visit: Payer: Self-pay | Admitting: Oncology

## 2023-07-31 IMAGING — MG MM DIGITAL DIAGNOSTIC UNILAT*R* W/ TOMO W/ CAD
8 series · 8 of 24 positions shown · non-contrast
Comparison: Previous exam(s).

CLINICAL DATA: Patient recalled from screening for right breast
mass.

EXAM:
DIGITAL DIAGNOSTIC UNILATERAL RIGHT MAMMOGRAM WITH TOMOSYNTHESIS AND
CAD; ULTRASOUND RIGHT BREAST LIMITED
TECHNIQUE: Right digital diagnostic mammography and breast tomosynthesis was
performed. The images were evaluated with computer-aided detection.;
Targeted ultrasound examination of the right breast was performed

[R MLO synth-2D]
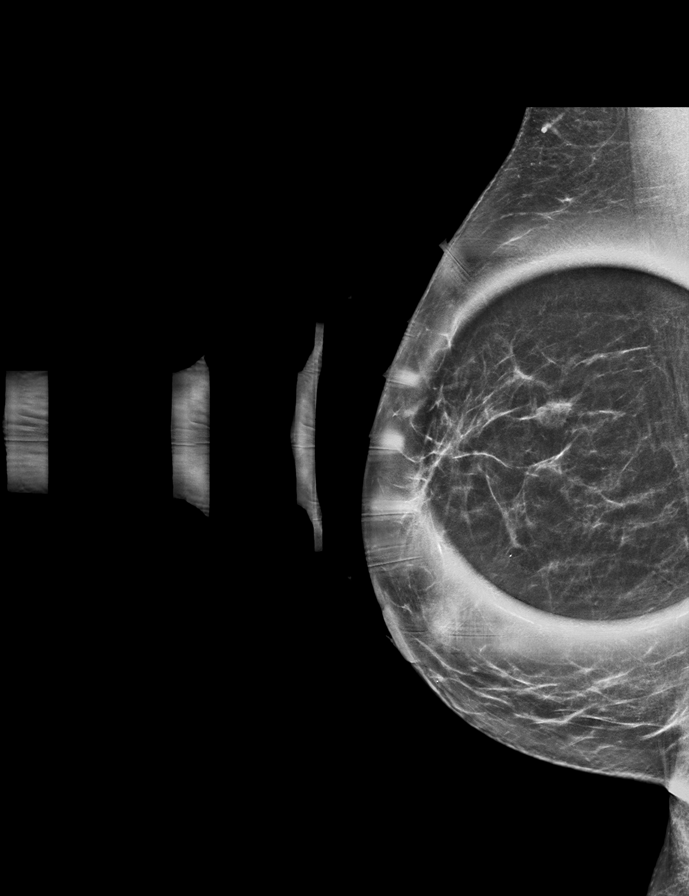

[L MLO synth-2D]
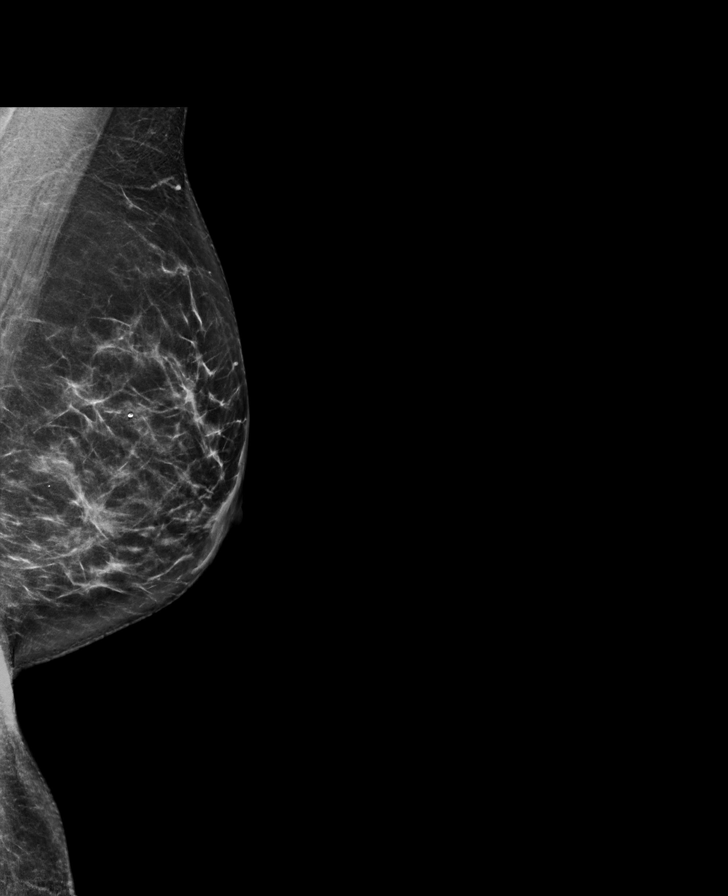

[R ML synth-2D]
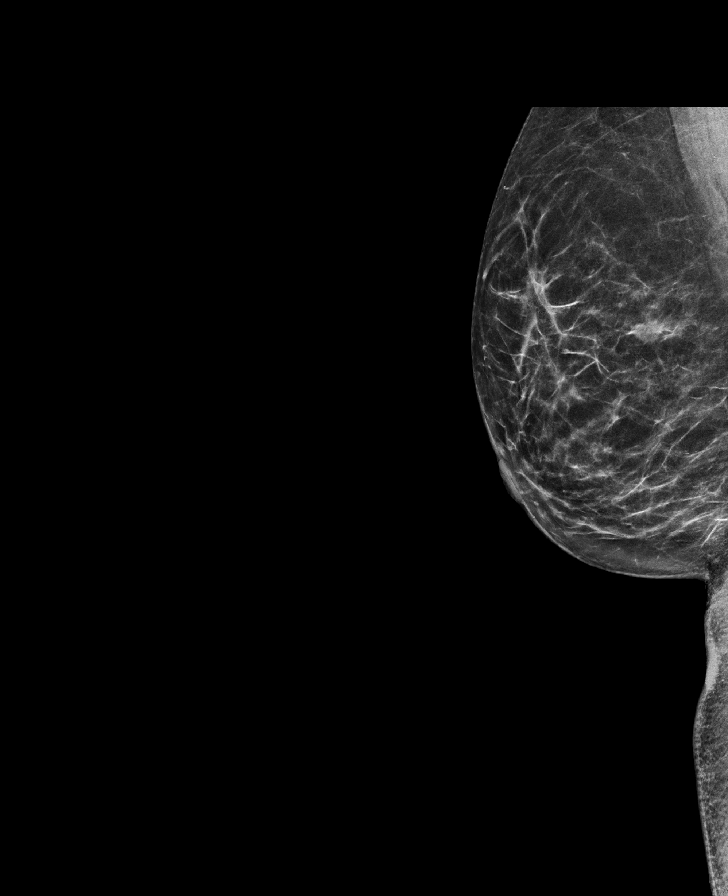

[R XCCL synth-2D]
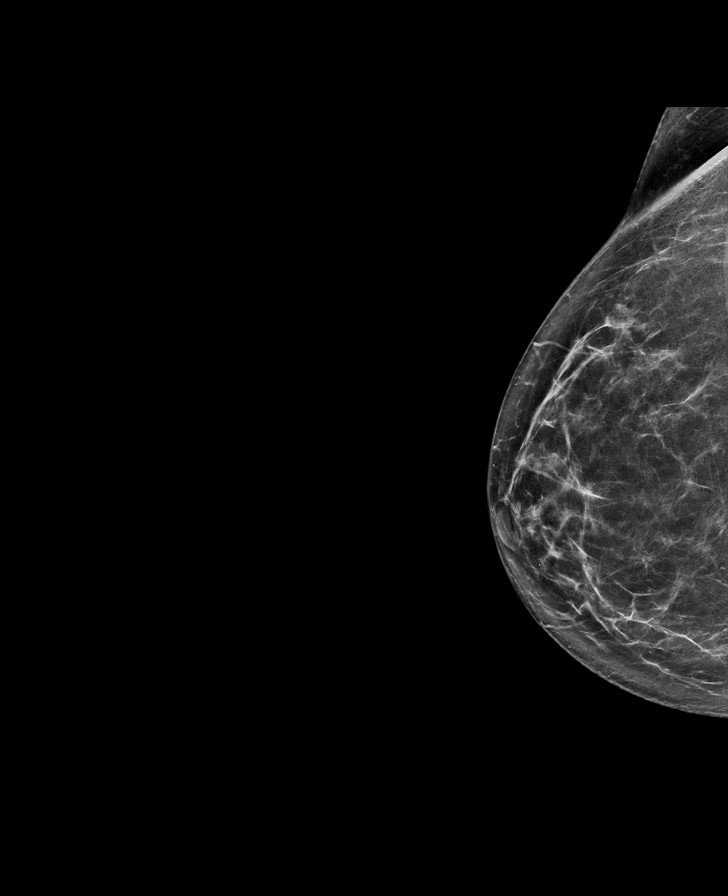

[R MLO tomo · tomo slice 31/62.0]
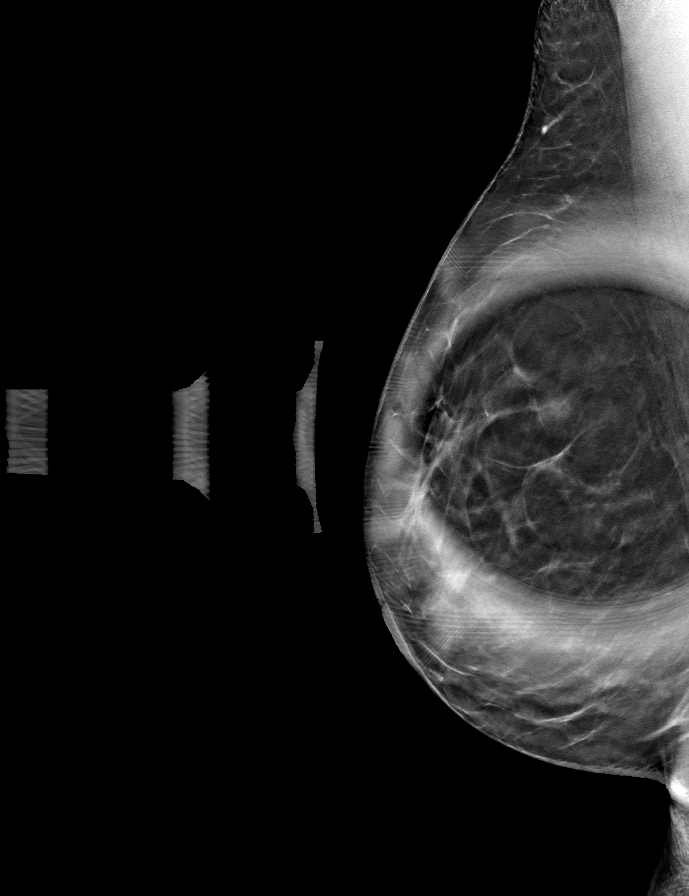

[L MLO tomo · tomo slice 35/68.0]
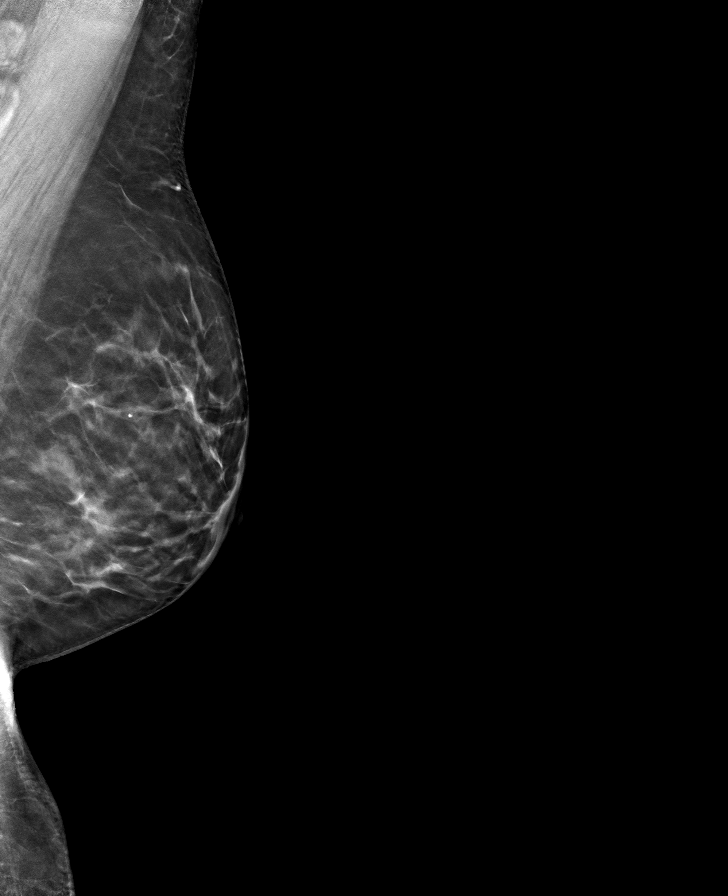

[R ML tomo · tomo slice 32/63.0]
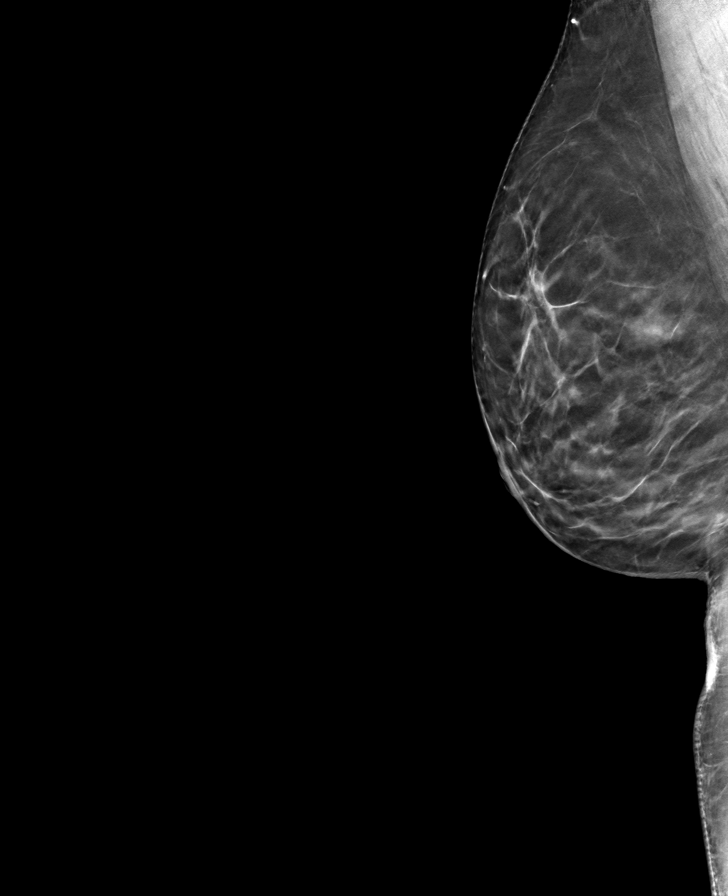

[R XCCL tomo · tomo slice 36/71.0]
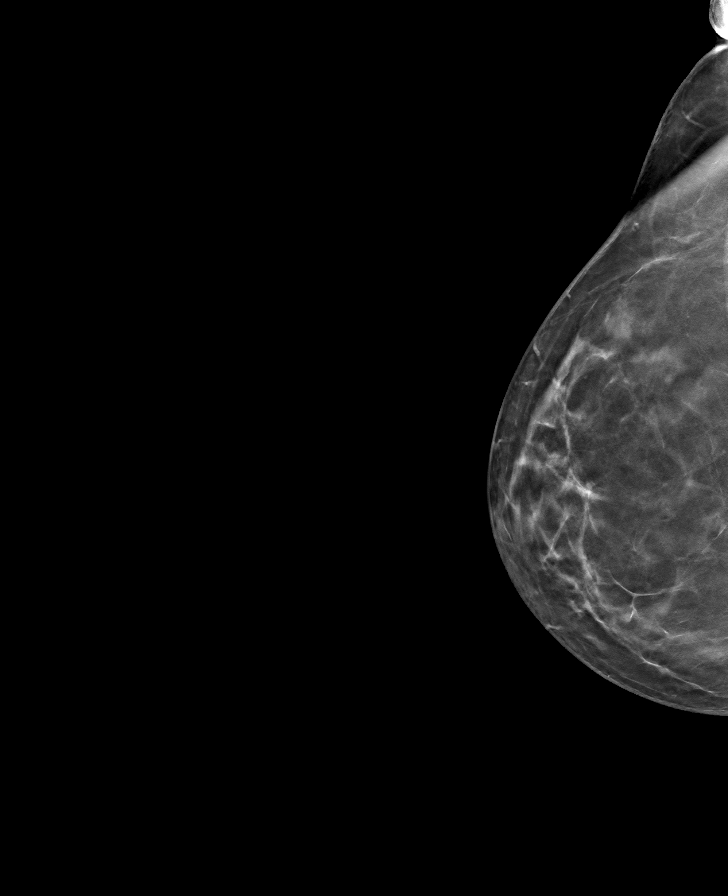

[8 of 24 positions shown; findings below may reference images not displayed]

ACR Breast Density Category c: The breast tissue is heterogeneously
dense, which may obscure small masses.
FINDINGS: Within the upper-outer right breast middle to posterior depth there
is a persistent small spiculated mass further evaluated with
additional imaging. Additional left MLO view was obtained. No
suspicious abnormality within the left breast.

Targeted ultrasound is performed, showing a 9 x 8 x 8 mm irregular
hypoechoic mass right breast 9:30 o'clock 4 cm from the nipple. No
right axillary adenopathy.
IMPRESSION: Suspicious right breast mass 9:30 o'clock.

RECOMMENDATION:
Ultrasound-guided core needle biopsy right breast mass 9:30 o'clock.

I have discussed the findings and recommendations with the patient.
If applicable, a reminder letter will be sent to the patient
regarding the next appointment.

BI-RADS CATEGORY  5: Highly suggestive of malignancy.

## 2023-09-23 ENCOUNTER — Other Ambulatory Visit: Payer: Self-pay | Admitting: General Surgery

## 2023-09-23 DIAGNOSIS — Z853 Personal history of malignant neoplasm of breast: Secondary | ICD-10-CM

## 2023-10-19 ENCOUNTER — Ambulatory Visit
Admission: RE | Admit: 2023-10-19 | Discharge: 2023-10-19 | Disposition: A | Discharge status: Home / Self Care | Payer: Federal, State, Local not specified - PPO | Source: Ambulatory Visit | Attending: General Surgery | Admitting: General Surgery

## 2023-10-19 DIAGNOSIS — Z853 Personal history of malignant neoplasm of breast: Secondary | ICD-10-CM | POA: Insufficient documentation

## 2023-10-21 ENCOUNTER — Other Ambulatory Visit: Payer: Self-pay

## 2023-11-24 ENCOUNTER — Other Ambulatory Visit: Payer: Self-pay

## 2023-12-16 ENCOUNTER — Other Ambulatory Visit: Payer: Self-pay | Admitting: Oncology

## 2024-01-12 ENCOUNTER — Other Ambulatory Visit: Payer: Self-pay | Admitting: Oncology

## 2024-07-09 ENCOUNTER — Other Ambulatory Visit: Payer: Self-pay | Admitting: Oncology
